# Patient Record
Sex: Male | Born: 1960 | Race: White | Hispanic: No | Marital: Single | Smoking: Never smoker
Health system: Southern US, Community
[De-identification: ages and names within clinical notes are randomized; demographics above are authoritative.]

## PROBLEM LIST (undated history)

## (undated) DIAGNOSIS — G20 Parkinson's disease: Secondary | ICD-10-CM

## (undated) DIAGNOSIS — G20A1 Parkinson's disease without dyskinesia, without mention of fluctuations: Secondary | ICD-10-CM

## (undated) DIAGNOSIS — IMO0002 Reserved for concepts with insufficient information to code with codable children: Secondary | ICD-10-CM

## (undated) HISTORY — DX: Reserved for concepts with insufficient information to code with codable children: IMO0002

## (undated) HISTORY — PX: FACIAL LACERATIONS REPAIR: SHX1571

---

## 2002-12-20 ENCOUNTER — Emergency Department (HOSPITAL_COMMUNITY): Admission: EM | Admit: 2002-12-20 | Discharge: 2002-12-20 | Payer: Self-pay | Admitting: Emergency Medicine

## 2005-02-06 ENCOUNTER — Ambulatory Visit: Payer: Self-pay | Admitting: Internal Medicine

## 2005-04-14 ENCOUNTER — Ambulatory Visit: Payer: Self-pay | Admitting: Internal Medicine

## 2005-09-05 ENCOUNTER — Ambulatory Visit: Payer: Self-pay | Admitting: Internal Medicine

## 2006-03-27 ENCOUNTER — Ambulatory Visit: Payer: Self-pay | Admitting: Internal Medicine

## 2006-11-10 ENCOUNTER — Ambulatory Visit: Payer: Self-pay | Admitting: Internal Medicine

## 2007-01-26 ENCOUNTER — Ambulatory Visit: Payer: Self-pay | Admitting: Internal Medicine

## 2007-09-14 ENCOUNTER — Emergency Department (HOSPITAL_COMMUNITY): Admission: EM | Admit: 2007-09-14 | Discharge: 2007-09-14 | Payer: Self-pay | Admitting: Emergency Medicine

## 2007-10-08 ENCOUNTER — Encounter: Payer: Self-pay | Admitting: Internal Medicine

## 2008-05-06 ENCOUNTER — Emergency Department (HOSPITAL_COMMUNITY): Admission: EM | Admit: 2008-05-06 | Discharge: 2008-05-07 | Payer: Self-pay | Admitting: Emergency Medicine

## 2008-06-10 ENCOUNTER — Emergency Department (HOSPITAL_COMMUNITY): Admission: EM | Admit: 2008-06-10 | Discharge: 2008-06-10 | Payer: Self-pay | Admitting: Emergency Medicine

## 2009-07-02 ENCOUNTER — Ambulatory Visit: Payer: Self-pay | Admitting: Internal Medicine

## 2009-07-02 DIAGNOSIS — F988 Other specified behavioral and emotional disorders with onset usually occurring in childhood and adolescence: Secondary | ICD-10-CM | POA: Insufficient documentation

## 2009-07-03 DIAGNOSIS — Z8711 Personal history of peptic ulcer disease: Secondary | ICD-10-CM

## 2011-02-21 NOTE — Assessment & Plan Note (Signed)
[facility]                                 ON-CALL NOTE   KARVER, FADDEN                        MRN:          [id number]  DATE:05/13/2007                            DOB:          [birth year]    PHONE NUMBER:  [phone].   PRIMARY CARE PHYSICIAN:  Dr. Debby Bud.   TIME OF CALL:  9:34 p.m.   SUBJECTIVE:  Micheal Carter reports that he is having pain and burning in  his rectal area that he feels is associated with hemorrhoids. He has had  similar problems in the past. He states he had a small amount of bright  red blood per rectum yesterday and none today. He denies dizziness,  chest pain or shortness of breath,   ASSESSMENT/PLAN:  Recommended sitz baths 15-20 minutes 3-4 times a day.  I called in a prescription for AnaMantle ointment to apply b.i.d. If his  symptoms are not improving, I suggested he see a physician tomorrow if  they are severe and on Monday if things start to improve. He may need to  be seen to have a thrombosed hemorrhoid removed.     Kerby Nora, MD  Electronically Signed    AB/MedQ  DD: 05/13/2007  DT: 05/14/2007  Job #: [id number]

## 2011-07-14 LAB — COMPREHENSIVE METABOLIC PANEL
ALT: 29
AST: 22
Albumin: 3.7
Calcium: 9.3
Creatinine, Ser: 0.77
Glucose, Bld: 97
Sodium: 138

## 2011-07-14 LAB — POCT CARDIAC MARKERS
CKMB, poc: <1 — ABNORMAL LOW
Myoglobin, poc: 62.2
Operator id: 4001
Troponin i, poc: <0.05

## 2011-07-14 LAB — APTT: aPTT: 29

## 2011-07-14 LAB — DIFFERENTIAL
Basophils Absolute: 0.1
Basophils Relative: 2 — ABNORMAL HIGH
Eosinophils Absolute: 0.1 — ABNORMAL LOW
Eosinophils Relative: 2
Lymphocytes Relative: 28
Monocytes Absolute: 0.4
Neutrophils Relative %: 58

## 2011-07-14 LAB — PROTIME-INR
INR: 1.1
Prothrombin Time: 14.5

## 2011-07-14 LAB — CBC
MCHC: 34.8
MCV: 86
WBC: 4.4

## 2011-08-10 ENCOUNTER — Encounter: Payer: Self-pay | Admitting: *Deleted

## 2011-08-10 ENCOUNTER — Emergency Department (HOSPITAL_COMMUNITY)
Admission: EM | Admit: 2011-08-10 | Discharge: 2011-08-10 | Disposition: A | Discharge status: Home / Self Care | Payer: Self-pay | Attending: Emergency Medicine | Admitting: Emergency Medicine

## 2011-08-10 ENCOUNTER — Emergency Department (HOSPITAL_COMMUNITY): Payer: Self-pay

## 2011-08-10 DIAGNOSIS — M25461 Effusion, right knee: Secondary | ICD-10-CM

## 2011-08-10 DIAGNOSIS — Z7982 Long term (current) use of aspirin: Secondary | ICD-10-CM | POA: Insufficient documentation

## 2011-08-10 DIAGNOSIS — IMO0002 Reserved for concepts with insufficient information to code with codable children: Secondary | ICD-10-CM | POA: Insufficient documentation

## 2011-08-10 DIAGNOSIS — M1710 Unilateral primary osteoarthritis, unspecified knee: Secondary | ICD-10-CM

## 2011-08-10 DIAGNOSIS — M25469 Effusion, unspecified knee: Secondary | ICD-10-CM | POA: Insufficient documentation

## 2011-08-10 DIAGNOSIS — M25569 Pain in unspecified knee: Secondary | ICD-10-CM | POA: Insufficient documentation

## 2011-08-10 MED ORDER — IBUPROFEN 800 MG PO TABS: 800.0000 mg | ORAL_TABLET | Freq: Three times a day (TID) | ORAL | Status: AC | PRN

## 2011-08-10 NOTE — ED Notes (Signed)
Pt c/o rt knee swelling for a week to 10 days, noted to have swelling on arrival. Able to ambulate on ext

## 2011-08-10 NOTE — ED Provider Notes (Signed)
History     CSN: [id number] Arrival date & time: 08/10/2011 10:26 AM   First MD Initiated Contact with Patient 08/10/11 1051      Chief Complaint  Patient presents with  . Knee Pain    (Consider location/radiation/quality/duration/timing/severity/associated sxs/prior treatment) Patient is a 50 y.o. male presenting with knee pain. The history is provided by the patient.  Knee Pain This is a new problem.   patient states that about a week, and he developed knee soreness and pain.  He states that over that time it has gradually gotten more swollen.  Patient states that he did not fall or injure the knee in any way.  Patient states, that he has never had history of gout or any problems with his knee.  This is involving his right knee.   History reviewed. No pertinent past medical history.  History reviewed. No pertinent past surgical history.  No family history on file.  History  Substance Use Topics  . Smoking status: Never Smoker   . Smokeless tobacco: Not on file  . Alcohol Use: Yes      Review of Systems  All other systems reviewed and are negative.    Allergies  Review of patient's allergies indicates no known allergies.  Home Medications   Current Outpatient Rx  Name Route Sig Dispense Refill  . ASPIRIN EC 81 MG PO TBEC Oral Take 81 mg by mouth daily as needed. For headache/pain.       BP 150/80  Pulse 92  Temp(Src) 99.3 F (37.4 C) (Oral)  Resp 18  SpO2 97%  Physical Exam  Constitutional: He appears well-developed and well-nourished.  Cardiovascular: Normal rate and regular rhythm.   Pulmonary/Chest: Effort normal and breath sounds normal.  Musculoskeletal:       Right knee: He exhibits swelling and effusion. He exhibits no ecchymosis, no deformity, no erythema and normal patellar mobility.  Skin: Skin is warm, dry and intact.    ED Course  Procedures (including critical care time)  Labs Reviewed - No data to display Dg Knee Complete 4 Views  Right  08/10/2011  *RADIOLOGY REPORT*  Clinical Data: Pain and swelling  RIGHT KNEE - COMPLETE 4+ VIEW  Comparison: None.  Findings: No evidence for fracture.  No subluxation or dislocation. Hypertrophic spurring is visible in all three compartments.  Joint effusion is evident within the suprapatellar bursa.  IMPRESSION: Tricompartmental degenerative changes with joint effusion.  Original Report Authenticated By: ERIC A. MANSELL, M.D.     No diagnosis found.    MDM  Patient has a joint effusion, which is most likely related to a degeneration of his knee Joint.  The patient is offered needle aspiration of the joint and he refused.  This seems less likely to be a septic joint based on the physical exam findings and x-ray findings.  The patient will be referred to orthopedics and given NSAIDs for pain and swelling.  Knee sleeve will be applied for compression       Carlyle Dolly, PA 08/10/11 1205

## 2011-08-10 NOTE — ED Provider Notes (Signed)
Medical screening examination/treatment/procedure(s) were performed by non-physician practitioner and as supervising physician. I saw and examined the patient and discussed test results. Right lower extremity knee swollen slightly not red or warm. Diffusion present neurovascular intact Medical decision making: Knee effusion most likely secondary to degenerative arthritis. Strongly doubt gallop or infection.  Doug Sou, MD 08/10/11 1227

## 2012-02-22 ENCOUNTER — Emergency Department (HOSPITAL_COMMUNITY)
Admission: EM | Admit: 2012-02-22 | Discharge: 2012-02-22 | Disposition: A | Discharge status: Home / Self Care | Payer: Self-pay | Attending: Emergency Medicine | Admitting: Emergency Medicine

## 2012-02-22 ENCOUNTER — Encounter (HOSPITAL_COMMUNITY): Payer: Self-pay

## 2012-02-22 DIAGNOSIS — R0789 Other chest pain: Secondary | ICD-10-CM | POA: Insufficient documentation

## 2012-02-22 DIAGNOSIS — R209 Unspecified disturbances of skin sensation: Secondary | ICD-10-CM | POA: Insufficient documentation

## 2012-02-22 DIAGNOSIS — R202 Paresthesia of skin: Secondary | ICD-10-CM

## 2012-02-22 DIAGNOSIS — R29898 Other symptoms and signs involving the musculoskeletal system: Secondary | ICD-10-CM | POA: Insufficient documentation

## 2012-02-22 LAB — GLUCOSE, CAPILLARY: Glucose-Capillary: 102 mg/dL — ABNORMAL HIGH (ref 70–99)

## 2012-02-22 NOTE — ED Notes (Signed)
Pt c/o tingling in left arm that began "3-4 days ago". Pt denies pain in arm. Pt states the sensation "comes and goes" and at random, no precipitating factors that he notices. Pt denies numbness or tingling anywhere else in the body, denies headache, fever, cough, NVD. Pt states he had mild chest discomfort for a short time a few days ago but nothing since.

## 2012-02-22 NOTE — Discharge Instructions (Signed)
Paresthesia Paresthesia is a burning or prickling feeling. This feeling can happen in any part of the body. It often happens in the hands, arms, legs, or feet. HOME CARE  Avoid drinking alcohol.   Try massage or needle therapy (acupuncture) to help with your problems.   Keep all doctor visits as told.  GET HELP RIGHT AWAY IF:   You feel weak.   You have trouble walking or moving.   You have problems speaking or seeing.   You feel confused.   You cannot control when you poop (bowel movement) or pee (urinate).   You lose feeling (numbness) after an injury.   You pass out (faint).   Your burning or prickling feeling gets worse when you walk.   You have pain, cramps, or feel dizzy.   You have a rash.  MAKE SURE YOU:   Understand these instructions.   Will watch your condition.   Will get help right away if you are not doing well or get worse.  Document Released: 09/04/2008 Document Revised: 09/11/2011 Document Reviewed: 06/13/2011 [employer] Patient Information 2012 [employer]. 

## 2012-02-22 NOTE — ED Notes (Signed)
Pt in from home with c/o left arm tingling states noted several days ago states some chest discomfort states pain is centered ache, denies nausea denies sob

## 2012-02-22 NOTE — ED Provider Notes (Signed)
History     CSN: [id number] Arrival date & time 02/22/12  1859 First MD Initiated Contact with Patient 02/22/12 2023     Chief Complaint  Patient presents with  . Extremity Weakness    HPI Patient presents to the emergency room with complaints of left arm tingling. Patient states the symptoms have been rather mild and have been ongoing for the last couple of days. Symptoms seem to come and go. He cannot think of anything in particular that precipitates it. 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 The symptoms have started probably a day or 2 before that. He has not had any neck pain. He has not had any headache, numbness or weakness. He has not had any trouble with his grip strength or his balance. He has not had any trouble with slurred speech or his vision. Patient also mentions that intermittently he has had chest discomfort that has been ongoing for years and was not associated with these symptoms today.  History reviewed. No pertinent past medical history.  History reviewed. No pertinent past surgical history.  History reviewed. No pertinent family history.  History  Substance Use Topics  . Smoking status: Never Smoker   . Smokeless tobacco: Not on file  . Alcohol Use: Yes      Review of Systems  All other systems reviewed and are negative.    Allergies  Review of patient's allergies indicates no known allergies.  Home Medications   Current Outpatient Rx  Name Route Sig Dispense Refill  . ASPIRIN EC 81 MG PO TBEC Oral Take 81 mg by mouth daily as needed. For headache/pain.       BP 168/100  Pulse 90  Temp(Src) 98.6 F (37 C) (Oral)  Resp 20  Ht 5\' 10"  (1.778 m)  Wt 195 lb (88.451 kg)  BMI 27.98 kg/m2  SpO2 98%  Physical Exam  Nursing note and vitals reviewed. Constitutional: He is oriented to person, place, and time. He appears well-developed and  well-nourished. No distress.  HENT:  Head: Normocephalic and atraumatic.  Right Ear: External ear normal.  Left Ear: External ear normal.  Mouth/Throat: Oropharynx is clear and moist.  Eyes: Conjunctivae are normal. Right eye exhibits no discharge. Left eye exhibits no discharge. No scleral icterus.  Neck: Neck supple. No tracheal deviation present.  Cardiovascular: Normal rate, regular rhythm and intact distal pulses.   Pulmonary/Chest: Effort normal and breath sounds normal. No stridor. No respiratory distress. He has no wheezes. He has no rales.  Abdominal: Soft. Bowel sounds are normal. He exhibits no distension. There is no tenderness. There is no rebound and no guarding.  Musculoskeletal: He exhibits no edema and no tenderness.  Neurological: He is alert and oriented to person, place, and time. He has normal strength. No sensory deficit. Cranial nerve deficit:  no gross defecits noted. He exhibits normal muscle tone. He displays no seizure activity. Coordination normal.       No pronator drift bilateral upper extrem, able to hold both legs off bed for 5 seconds, sensation intact in all extremities, no visual field cuts, no left or right sided neglect  Skin: Skin is warm and dry. No rash noted.  Psychiatric: He has a normal mood and affect.    ED Course  Procedures (including critical care time)  Rate: 86  Rhythm: normal sinus rhythm  QRS Axis: normal  Intervals:  normal  ST/T Wave abnormalities: normal  Conduction Disutrbances:none  Narrative Interpretation:   Old EKG Reviewed: none available  Labs Reviewed - No data to display No results found.  MDM  Patient has mild paresthesias. He came to the emergency room because he cooled with some of these symptoms could suggest. Patient does not have any other neurologic findings on exam. His symptoms are rather mild. I discussed doing brain imaging for further evaluation and the patient states he would rather not do that. At this time  I feel that it is reasonable. I did encourage him to follow up with his primary Dr. for a followup exam and routine medical screening. I instructed patient to return to emergency room for worsening symptoms, slurred speech, numbness or weakness.        Celene Kras, MD 02/22/12 2045

## 2012-06-03 ENCOUNTER — Encounter: Payer: Self-pay | Admitting: Internal Medicine

## 2012-06-03 ENCOUNTER — Ambulatory Visit (INDEPENDENT_AMBULATORY_CARE_PROVIDER_SITE_OTHER): Payer: Self-pay | Admitting: Internal Medicine

## 2012-06-03 ENCOUNTER — Ambulatory Visit: Payer: Self-pay

## 2012-06-03 VITALS — BP 130/88 | HR 79 | Temp 98.7°F | Wt 207.2 lb

## 2012-06-03 DIAGNOSIS — F1010 Alcohol abuse, uncomplicated: Secondary | ICD-10-CM

## 2012-06-03 LAB — HEPATIC FUNCTION PANEL
ALT: 39 U/L (ref 0–53)
AST: 33 U/L (ref 0–37)
Albumin: 4.3 g/dL (ref 3.5–5.2)
Bilirubin, Direct: 0.1 mg/dL (ref 0.0–0.3)
Total Bilirubin: 0.9 mg/dL (ref 0.3–1.2)
Total Protein: 8 g/dL (ref 6.0–8.3)

## 2012-06-03 LAB — PROTIME-INR: Prothrombin Time: 14.3 seconds (ref 11.6–15.2)

## 2012-06-03 NOTE — Progress Notes (Signed)
  Subjective:    Patient ID: Micheal Carter, male    DOB: 1960-12-15, 51 y.o.   MRN: [id number]  HPI 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 He reports that he had jaundice last summer. He has no prior diagnosis of liver disease but he is very concerned about potential liver damage.    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     Objective:   Physical Exam Filed Vitals:   06/03/12 1126  BP: 130/88  Pulse: 79  Temp: 98.7 F (37.1 C)   Gen'l - WNWD white man in no distress HEENT C&S clear, w/o icterus Cor- RRR Pulm - normal respirations Abd - no palpable liver edge, normal liver span by percussion. Neuro - A&O x 3, no agitation, clear mentation, no delusions or hallucinations (5 days since last drink)       Assessment & Plan:

## 2012-06-03 NOTE — Patient Instructions (Addendum)
Alcohol abuse - it is a very positive event -deciding to stop drinking and actually being abstemious for 5 days. This is a disease and you need to actively work a program of recovery in order to remain abstemious long term. This will obviously have many health benefits for you. The best approach, with a proven track record, is AA. I strongly recommend that you get involved with an AA group NOW.  On exam you skin appears normal, you have no palpable liver abnormality.  Plan- will check liver functions and blood clotting time today to evaluate liver function. Additional studies, if any, will be based on these results.  WORKING YOUR RECOVERY IS THE ABSOLUTE BEST THING YOU CAN DO FOR YOUR HEALTH.   Finding Treatment for Alcohol and Drug Addiction It can be hard to find the right place to get professional treatment. Here are some important things to consider:  There are different types of treatment to choose from.   Some programs are live-in (residential) while others are not (outpatient). Sometimes a combination is offered.   No single type of program is right for everyone.   Most treatment programs involve a combination of education, counseling, and a 12-step, spiritually-based approach.   There are non-spiritually based programs (not 12-step).   Some treatment programs are government sponsored. They are geared for patients without private insurance.   Treatment programs can vary in many respects such as:   Cost and types of insurance accepted.   Types of on-site medical services offered.   Length of stay, setting, and size.   Overall philosophy of treatment.   A person may need specialized treatment or have needs not addressed by all programs. For example, adolescents need treatment appropriate for their age. Other people have secondary disorders that must be managed as well. Secondary conditions can include mental illness, such as depression or diabetes. Often, a period of  detoxification from alcohol or drugs is needed. This requires medical supervision and not all programs offer this. THINGS TO CONSIDER WHEN SELECTING A TREATMENT PROGRAM    Is the program certified by the appropriate government agency? Even private programs must be certified and employ certified professionals.   Does the program accept your insurance? If not, can a payment plan be set up?   Is the facility clean, organized, and well run? Do they allow you to speak with graduates who can share their treatment experience with you? Can you tour the facility? Can you meet with staff?   Does the program meet the full range of individual needs?   Does the treatment program address sexual orientation and physical disabilities? Do they provide age, gender, and culturally appropriate treatment services?   Is treatment available in languages other than English?   Is long-term aftercare support or guidance encouraged and provided?   Is assessment of an individual's treatment plan ongoing to ensure it meets changing needs?   Does the program use strategies to encourage reluctant patients to remain in treatment long enough to increase the likelihood of success?   Does the program offer counseling (individual or group) and other behavioral therapies?   Does the program offer medicine as part of the treatment regimen, if needed?   Is there ongoing monitoring of possible relapse? Is there a defined relapse prevention program? Are services or referrals offered to family members to ensure they understand addiction and the recovery process? This would help them support the recovering individual.   Are 12-step meetings held at the center or  is transport available for patients to attend outside meetings?  In countries outside of the [place]. and [place], Magazine features editor for contact information for services in your area. Document Released: 08/21/2005 Document Revised: 09/11/2011 Document Reviewed:  03/02/2008 [facility] Patient Information 2012 [place], [place].

## 2012-06-05 NOTE — Assessment & Plan Note (Signed)
Patient is ready to stop drinking and embark on recovery. Discussed addiction. He has no stigmata or exam findings to suggest cirrhosis. Skin lesions are benign.   Plan Strongly recommended AA for a durable recovery  Lab - routine LFTs and PT  Addendum - labs are normal.

## 2012-06-11 ENCOUNTER — Encounter: Payer: Self-pay | Admitting: Internal Medicine

## 2012-11-11 ENCOUNTER — Emergency Department (HOSPITAL_COMMUNITY): Payer: Self-pay

## 2012-11-11 ENCOUNTER — Encounter (HOSPITAL_COMMUNITY): Payer: Self-pay | Admitting: *Deleted

## 2012-11-11 ENCOUNTER — Emergency Department (HOSPITAL_COMMUNITY)
Admission: EM | Admit: 2012-11-11 | Discharge: 2012-11-11 | Disposition: A | Discharge status: Home / Self Care | Payer: Self-pay | Attending: Emergency Medicine | Admitting: Emergency Medicine

## 2012-11-11 DIAGNOSIS — R29898 Other symptoms and signs involving the musculoskeletal system: Secondary | ICD-10-CM

## 2012-11-11 DIAGNOSIS — R0989 Other specified symptoms and signs involving the circulatory and respiratory systems: Secondary | ICD-10-CM | POA: Insufficient documentation

## 2012-11-11 DIAGNOSIS — M6281 Muscle weakness (generalized): Secondary | ICD-10-CM | POA: Insufficient documentation

## 2012-11-11 DIAGNOSIS — Z7982 Long term (current) use of aspirin: Secondary | ICD-10-CM | POA: Insufficient documentation

## 2012-11-11 DIAGNOSIS — J3489 Other specified disorders of nose and nasal sinuses: Secondary | ICD-10-CM | POA: Insufficient documentation

## 2012-11-11 DIAGNOSIS — R0609 Other forms of dyspnea: Secondary | ICD-10-CM | POA: Insufficient documentation

## 2012-11-11 DIAGNOSIS — Z8711 Personal history of peptic ulcer disease: Secondary | ICD-10-CM | POA: Insufficient documentation

## 2012-11-11 LAB — COMPREHENSIVE METABOLIC PANEL WITH GFR
ALT: 15 U/L (ref 0–53)
AST: 13 U/L (ref 0–37)
Albumin: 3.9 g/dL (ref 3.5–5.2)
Alkaline Phosphatase: 59 U/L (ref 39–117)
BUN: 21 mg/dL (ref 6–23)
CO2: 26 meq/L (ref 19–32)
Calcium: 9.6 mg/dL (ref 8.4–10.5)
Chloride: 99 meq/L (ref 96–112)
Creatinine, Ser: 1.09 mg/dL (ref 0.50–1.35)
GFR calc Af Amer: 88 mL/min — ABNORMAL LOW
GFR calc non Af Amer: 76 mL/min — ABNORMAL LOW
Glucose, Bld: 91 mg/dL (ref 70–99)
Potassium: 4.5 meq/L (ref 3.5–5.1)
Sodium: 136 meq/L (ref 135–145)
Total Bilirubin: 0.3 mg/dL (ref 0.3–1.2)
Total Protein: 7.9 g/dL (ref 6.0–8.3)

## 2012-11-11 LAB — CBC WITH DIFFERENTIAL/PLATELET
Basophils Absolute: 0 K/uL (ref 0.0–0.1)
Basophils Relative: 0 % (ref 0–1)
Eosinophils Absolute: 0.2 K/uL (ref 0.0–0.7)
Eosinophils Relative: 2 % (ref 0–5)
HCT: 39 % (ref 39.0–52.0)
Hemoglobin: 13.5 g/dL (ref 13.0–17.0)
Lymphocytes Relative: 22 % (ref 12–46)
Lymphs Abs: 1.8 K/uL (ref 0.7–4.0)
MCH: 28.3 pg (ref 26.0–34.0)
MCHC: 34.6 g/dL (ref 30.0–36.0)
MCV: 81.8 fL (ref 78.0–100.0)
Monocytes Absolute: 0.9 K/uL (ref 0.1–1.0)
Monocytes Relative: 11 % (ref 3–12)
Neutro Abs: 5.1 K/uL (ref 1.7–7.7)
Neutrophils Relative %: 65 % (ref 43–77)
Platelets: 251 K/uL (ref 150–400)
RBC: 4.77 MIL/uL (ref 4.22–5.81)
RDW: 12.7 % (ref 11.5–15.5)
WBC: 8 K/uL (ref 4.0–10.5)

## 2012-11-11 LAB — RAPID URINE DRUG SCREEN, HOSP PERFORMED
Amphetamines: NOT DETECTED
Barbiturates: NOT DETECTED
Benzodiazepines: NOT DETECTED
Cocaine: NOT DETECTED
Opiates: NOT DETECTED
Tetrahydrocannabinol: NOT DETECTED

## 2012-11-11 LAB — URINALYSIS, ROUTINE W REFLEX MICROSCOPIC
Bilirubin Urine: NEGATIVE
Glucose, UA: NEGATIVE mg/dL
Hgb urine dipstick: NEGATIVE
Ketones, ur: NEGATIVE mg/dL
Leukocytes, UA: NEGATIVE
Nitrite: NEGATIVE
Protein, ur: NEGATIVE mg/dL
Specific Gravity, Urine: 1.016 (ref 1.005–1.030)
Urobilinogen, UA: 0.2 mg/dL (ref 0.0–1.0)
pH: 6 (ref 5.0–8.0)

## 2012-11-11 LAB — CK: Total CK: 84 U/L (ref 7–232)

## 2012-11-11 LAB — ETHANOL: Alcohol, Ethyl (B): <11 mg/dL (ref 0–11)

## 2012-11-11 LAB — MAGNESIUM: Magnesium: 2 mg/dL (ref 1.5–2.5)

## 2012-11-11 NOTE — ED Notes (Signed)
Patient reports bilateral leg weakness for several years.  Patient has had trouble getting up to a standing position.  Patient has had non-productive cough for three days.  Denies nausea, vomiting, chest pain.

## 2012-11-11 NOTE — ED Provider Notes (Signed)
History   This chart was scribed for non-physician practitioner Ebbie Ridge, PA-C working with Ward Givens, MD by Gerlean Ren, ED Scribe. This patient was seen in room WTR6/WTR6 and the patient's care was started at 7:53 PM.    CSN: [id number]  Arrival date & time 11/11/12  1905   First MD Initiated Contact with Patient 11/11/12 1934      Chief Complaint  Patient presents with  . Cough    The history is provided by the patient. No language interpreter was used.  Micheal Carter is a 52 y.o. male who presents to the Emergency Department complaining of increasingly labored breathing over the past 3 days with associated weakness in bilateral legs localized to feet and lower legs and dry cough.  Pt reports weakness began first followed by dyspnea and then cough, but that dyspnea is most concerning to him at this time.  Dyspnea is not noticeably worsened by ambulation or normal activities.  Pt denies any incident or activity that caused onset of symptoms.  Pt denies nausea, emesis, fevers, HA, blurred vision, swelling in legs, dysuria, sore throat.  Mild rhinorrhea over past week.  Pt denies any specific pain at this time.  No known allergies.  No regular medications.  Pt denies tobacco use, drug use, and states he has not consumed alcohol in 6 months.   PCP is Dr. Debby Bud. Past Medical History  Diagnosis Date  . Ulcer      peptic ulcer disease at age 56    Past Surgical History  Procedure Date  . Facial lacerations repair     Family History  Problem Relation Age of Onset  . Alcohol abuse Mother   . Alcohol abuse Father   . Heart disease Father   . Mental illness Father     bipolar disease    History  Substance Use Topics  . Smoking status: Never Smoker   . Smokeless tobacco: Not on file  . Alcohol Use: Yes      Review of Systems A complete 10 system review of systems was obtained and all systems are negative except as noted in the HPI and PMH.   Allergies  Review of  patient's allergies indicates no known allergies.  Home Medications   Current Outpatient Rx  Name  Route  Sig  Dispense  Refill  . AMPHETAMINE-DEXTROAMPHETAMINE 20 MG PO TABS   Oral   Take 20 mg by mouth daily.         . ASPIRIN EC 81 MG PO TBEC   Oral   Take 81 mg by mouth daily as needed. For headache/pain.            BP 152/87  Pulse 87  Temp 98.9 F (37.2 C) (Oral)  Resp 16  Ht 5\' 10"  (1.778 m)  Wt 210 lb (95.255 kg)  BMI 30.13 kg/m2  SpO2 97%  Physical Exam  Nursing note and vitals reviewed. Constitutional: He is oriented to person, place, and time. He appears well-developed and well-nourished. No distress.  HENT:  Head: Normocephalic and atraumatic.  Eyes: EOM are normal.  Neck: Neck supple. No tracheal deviation present.  Cardiovascular: Normal rate, regular rhythm and normal heart sounds.   Pulmonary/Chest: Effort normal and breath sounds normal. No respiratory distress. He has no wheezes.  Musculoskeletal: Normal range of motion.  Neurological: He is alert and oriented to person, place, and time.  Skin: Skin is warm and dry.  Psychiatric: He has a normal mood and affect.  His behavior is normal.    ED Course  Procedures (including critical care time) DIAGNOSTIC STUDIES: Oxygen Saturation is 97% on room air, adequate by my interpretation.    COORDINATION OF CARE: 8:00 PM- Informed pt of plan to take chest XR and perform blood work.  Pt understands medical decision-making process and agrees with plan.  Results for orders placed during the hospital encounter of 11/11/12  CBC WITH DIFFERENTIAL      Component Value Range   WBC 8.0  4.0 - 10.5 K/uL   RBC 4.77  4.22 - 5.81 MIL/uL   Hemoglobin 13.5  13.0 - 17.0 g/dL   HCT 40.9  81.1 - 91.4 %   MCV 81.8  78.0 - 100.0 fL   MCH 28.3  26.0 - 34.0 pg   MCHC 34.6  30.0 - 36.0 g/dL   RDW 78.2  95.6 - 21.3 %   Platelets 251  150 - 400 K/uL   Neutrophils Relative 65  43 - 77 %   Lymphocytes Relative 22  12 -  46 %   Monocytes Relative 11  3 - 12 %   Eosinophils Relative 2  0 - 5 %   Basophils Relative 0  0 - 1 %   Neutro Abs 5.1  1.7 - 7.7 K/uL   Lymphs Abs 1.8  0.7 - 4.0 K/uL   Monocytes Absolute 0.9  0.1 - 1.0 K/uL   Eosinophils Absolute 0.2  0.0 - 0.7 K/uL   Basophils Absolute 0.0  0.0 - 0.1 K/uL   Smear Review PLATELET CLUMPS NOTED ON SMEAR    COMPREHENSIVE METABOLIC PANEL      Component Value Range   Sodium 136  135 - 145 mEq/L   Potassium 4.5  3.5 - 5.1 mEq/L   Chloride 99  96 - 112 mEq/L   CO2 26  19 - 32 mEq/L   Glucose, Bld 91  70 - 99 mg/dL   BUN 21  6 - 23 mg/dL   Creatinine, Ser 0.86  0.50 - 1.35 mg/dL   Calcium 9.6  8.4 - 57.8 mg/dL   Total Protein 7.9  6.0 - 8.3 g/dL   Albumin 3.9  3.5 - 5.2 g/dL   AST 13  0 - 37 U/L   ALT 15  0 - 53 U/L   Alkaline Phosphatase 59  39 - 117 U/L   Total Bilirubin 0.3  0.3 - 1.2 mg/dL   GFR calc non Af Amer 76 (*) >90 mL/min   GFR calc Af Amer 88 (*) >90 mL/min  ETHANOL      Component Value Range   Alcohol, Ethyl (B) <11  0 - 11 mg/dL  URINE RAPID DRUG SCREEN (HOSP PERFORMED)      Component Value Range   Opiates NONE DETECTED  NONE DETECTED   Cocaine NONE DETECTED  NONE DETECTED   Benzodiazepines NONE DETECTED  NONE DETECTED   Amphetamines NONE DETECTED  NONE DETECTED   Tetrahydrocannabinol NONE DETECTED  NONE DETECTED   Barbiturates NONE DETECTED  NONE DETECTED  URINALYSIS, ROUTINE W REFLEX MICROSCOPIC      Component Value Range   Color, Urine YELLOW  YELLOW   APPearance CLOUDY (*) CLEAR   Specific Gravity, Urine 1.016  1.005 - 1.030   pH 6.0  5.0 - 8.0   Glucose, UA NEGATIVE  NEGATIVE mg/dL   Hgb urine dipstick NEGATIVE  NEGATIVE   Bilirubin Urine NEGATIVE  NEGATIVE   Ketones, ur NEGATIVE  NEGATIVE mg/dL  Protein, ur NEGATIVE  NEGATIVE mg/dL   Urobilinogen, UA 0.2  0.0 - 1.0 mg/dL   Nitrite NEGATIVE  NEGATIVE   Leukocytes, UA NEGATIVE  NEGATIVE  CK      Component Value Range   Total CK 84  7 - 232 U/L  MAGNESIUM       Component Value Range   Magnesium 2.0  1.5 - 2.5 mg/dL    Dg Chest 2 View  10/11/1094  *RADIOLOGY REPORT*  Clinical Data: Cough, shortness of breath  CHEST - 2 VIEW  Comparison: 05/06/2008; 09/14/2007  Findings: Unchanged cardiac silhouette and mediastinal contours. Grossly unchanged left basilar/retrocardiac opacity favored to represent atelectasis.  No new focal airspace opacity.  No definite pleural effusion or pneumothorax.  Unchanged bones.  IMPRESSION: No acute cardiopulmonary disease.   Original Report Authenticated By: Tacey Ruiz, MD    Ct Head Wo Contrast  11/11/2012  *RADIOLOGY REPORT*  Clinical Data: Lower leg weakness.  CT HEAD WITHOUT CONTRAST  Technique:  Contiguous axial images were obtained from the base of the skull through the vertex without contrast.  Comparison: None.  Findings: No intracranial hemorrhage.  Mild nonspecific white matter type changes most notable left frontal lobe without CT findings of large acute thrombotic infarct.  No hydrocephalus.  No intracranial mass lesion detected on this unenhanced exam.  Vascular calcifications.  Right cerebellar tonsils minimally low-lying but within the range of normal limits.  Orbital structures unremarkable.  Mastoid air cells, middle ear cavities and visualized sinuses are clear.  IMPRESSION: Mild nonspecific white matter type changes most notable left frontal lobe without CT evidence of large acute infarct.  No intracranial hemorrhage.  Vascular calcifications.   Original Report Authenticated By: Lacy Duverney, M.D.    Ct Lumbar Spine Wo Contrast  11/11/2012  *RADIOLOGY REPORT*  Clinical Data: Low back pain with bilateral leg weakness.  CT LUMBAR SPINE WITHOUT CONTRAST  Technique:  Multidetector CT imaging of the lumbar spine was performed without intravenous contrast administration. Multiplanar CT image reconstructions were also generated.  Comparison: None.  Findings: Vertebral body height and alignment are normal.  No pars  interarticularis defect is identified.  Imaged intra-abdominal contents are unremarkable.  T12-L1:  Negative.  L1-2:  Mild facet degenerative disease and minimal disc bulge without central canal or foraminal narrowing.  L2-L3:  Minimal disc bulging and mild facet arthropathy without central canal or foraminal stenosis.  L3-4:  Mild broad-based disc bulge is identified.  The central spinal canal and neural foramina appear open.  L4-5:  Mild facet degenerative disease and a shallow disc bulge. The central canal appears open.  Neural foramina appear mildly narrowed.  L5-S1:  Mild facet arthropathy.  Otherwise negative.  IMPRESSION: Mild degenerative disc disease without central canal or foraminal stenosis.  There is mild bilateral foraminal narrowing at L4-5.  No nerve root compression is identified.   Original Report Authenticated By: Holley Dexter, M.D.     I spoke with Dr. Lynelle Doctor, about the patient and she agreed that we need to do a more extensive workup on the patient.  The patient will be advised to return here as needed. Follow up with his PCP. The patient has normal gait here in the ER. The patient will need further outpatient work up.    MDM  MDM Reviewed: vitals and nursing note Interpretation: labs, ECG, x-ray and CT scan           Carlyle Dolly, PA-C 11/11/12 2311

## 2012-11-11 NOTE — ED Notes (Addendum)
EKG shown to MD Dr. Clarene Duke.

## 2012-11-11 NOTE — ED Notes (Signed)
Pt states has had cough/weakness/breathing feels different x 3 days; denies fever

## 2012-11-12 NOTE — ED Provider Notes (Signed)
Pt discussed with me  Medical screening examination/treatment/procedure(s) were performed by non-physician practitioner and as supervising physician I was immediately available for consultation/collaboration. Devoria Albe, MD, FACEP   Ward Givens, MD 11/12/12 [phone]

## 2013-02-21 ENCOUNTER — Encounter (HOSPITAL_COMMUNITY): Payer: Self-pay | Admitting: Emergency Medicine

## 2013-02-21 ENCOUNTER — Emergency Department (HOSPITAL_COMMUNITY)
Admission: EM | Admit: 2013-02-21 | Discharge: 2013-02-21 | Disposition: A | Discharge status: Home / Self Care | Payer: Self-pay | Attending: Emergency Medicine | Admitting: Emergency Medicine

## 2013-02-21 DIAGNOSIS — Y9389 Activity, other specified: Secondary | ICD-10-CM | POA: Insufficient documentation

## 2013-02-21 DIAGNOSIS — Z8711 Personal history of peptic ulcer disease: Secondary | ICD-10-CM | POA: Insufficient documentation

## 2013-02-21 DIAGNOSIS — Y92009 Unspecified place in unspecified non-institutional (private) residence as the place of occurrence of the external cause: Secondary | ICD-10-CM | POA: Insufficient documentation

## 2013-02-21 DIAGNOSIS — Z23 Encounter for immunization: Secondary | ICD-10-CM | POA: Insufficient documentation

## 2013-02-21 DIAGNOSIS — W1809XA Striking against other object with subsequent fall, initial encounter: Secondary | ICD-10-CM | POA: Insufficient documentation

## 2013-02-21 DIAGNOSIS — S0120XA Unspecified open wound of nose, initial encounter: Secondary | ICD-10-CM | POA: Insufficient documentation

## 2013-02-21 DIAGNOSIS — S0121XA Laceration without foreign body of nose, initial encounter: Secondary | ICD-10-CM

## 2013-02-21 MED ORDER — TETANUS-DIPHTH-ACELL PERTUSSIS 5-2.5-18.5 LF-MCG/0.5 IM SUSP
0.5000 mL | Freq: Once | INTRAMUSCULAR | Status: AC
  Administered 2013-02-21: 0.5 mL via INTRAMUSCULAR
  Filled 2013-02-21: qty 0.5

## 2013-02-21 NOTE — ED Provider Notes (Signed)
History     CSN: [id number]  Arrival date & time 02/21/13  1123   First MD Initiated Contact with Patient 02/21/13 1146      Chief Complaint  Patient presents with  . Facial Laceration    (Consider location/radiation/quality/duration/timing/severity/associated sxs/prior treatment) HPI Comments: Patient presents to the ED for facial laceration.  Patient notes he was in his attic yesterday looking for his pet in the dark when he tripped and fell. He hit the right side of his nose on a telescope leg and sustained a superficial laceration.  No associated LOC or nasal deformity.  Bleeding well controlled on arrival to the ED.  Last tetanus unknown.  Sensation intact.  The history is provided by the patient.    Past Medical History  Diagnosis Date  . Ulcer      peptic ulcer disease at age 22    Past Surgical History  Procedure Laterality Date  . Facial lacerations repair      Family History  Problem Relation Age of Onset  . Alcohol abuse Mother   . Alcohol abuse Father   . Heart disease Father   . Mental illness Father     bipolar disease    History  Substance Use Topics  . Smoking status: Never Smoker   . Smokeless tobacco: Not on file  . Alcohol Use: Yes      Review of Systems  Skin: Positive for wound.  All other systems reviewed and are negative.    Allergies  Review of patient's allergies indicates no known allergies.  Home Medications  No current outpatient prescriptions on file.  BP 161/87  Pulse 93  Temp(Src) 97.5 F (36.4 C) (Oral)  SpO2 96%  Physical Exam  Nursing note and vitals reviewed. Constitutional: He is oriented to person, place, and time. He appears well-developed and well-nourished.  HENT:  Head: Normocephalic and atraumatic.  Nose: Nose lacerations present. No mucosal edema, rhinorrhea, sinus tenderness, nasal deformity, septal deviation or nasal septal hematoma. No epistaxis.  No foreign bodies.    Small 3cm, superficial  laceration to right nostril, bleeding well controlled; no FB, surrounding erythema, or signs of infection  Eyes: Conjunctivae and EOM are normal.  Neck: Normal range of motion. Neck supple.  Cardiovascular: Normal rate, regular rhythm and normal heart sounds.   Pulmonary/Chest: Effort normal and breath sounds normal. No respiratory distress.  Musculoskeletal: Normal range of motion.  Neurological: He is alert and oriented to person, place, and time.  Skin: Skin is warm and dry.  Psychiatric: He has a normal mood and affect.    ED Course  Procedures (including critical care time)  LACERATION REPAIR Performed by: Garlon Hatchet Authorized by: Garlon Hatchet Consent: Verbal consent obtained. Risks and benefits: risks, benefits and alternatives were discussed Consent given by: patient Patient identity confirmed: provided demographic data Prepped and Draped in normal sterile fashion Wound explored  Laceration Location: left nostril  Laceration Length: 3cm  No Foreign Bodies seen or palpated  Anesthesia: none  Local anesthetic: none  Anesthetic total: none  Irrigation method: syringe Amount of cleaning: standard  Skin closure: dermabond  Number of sutures: none  Technique: n/a  Patient tolerance: Patient tolerated the procedure well with no immediate complications.   Labs Reviewed - No data to display No results found.   1. Laceration of nose without complication, initial encounter       MDM   52 year old male presenting to the ED for laceration of right nostril. The wound  was irrigated with normal saline, no foreign bodies present. Laceration repaired with Dermabond. Tetanus updated in the ED. Instructed on home wound care.  Followup with primary care physician if problems occur. Return precautions advised.    Garlon Hatchet, PA-C 02/21/13 1622  Garlon Hatchet, PA-C 02/21/13 1623

## 2013-02-21 NOTE — ED Notes (Signed)
Pt states that he fell yesterday and hit his rt side of his nose on the end of the telescope and has an abrasion to rt side of nose. Has brusing under rt eye no loc, no dizzyness. Alert x4, bleeding controlled and has some dried blood.

## 2013-02-22 NOTE — ED Provider Notes (Signed)
Medical screening examination/treatment/procedure(s) were performed by non-physician practitioner and as supervising physician I was immediately available for consultation/collaboration.    Keiffer L Teddie Curd, MD 02/22/13 0703 

## 2014-12-07 ENCOUNTER — Encounter (HOSPITAL_COMMUNITY): Payer: Self-pay | Admitting: Emergency Medicine

## 2014-12-07 ENCOUNTER — Emergency Department (HOSPITAL_COMMUNITY): Payer: Self-pay

## 2014-12-07 ENCOUNTER — Emergency Department (HOSPITAL_COMMUNITY)
Admission: EM | Admit: 2014-12-07 | Discharge: 2014-12-07 | Disposition: A | Discharge status: Home / Self Care | Payer: Self-pay | Attending: Emergency Medicine | Admitting: Emergency Medicine

## 2014-12-07 DIAGNOSIS — Z79899 Other long term (current) drug therapy: Secondary | ICD-10-CM | POA: Insufficient documentation

## 2014-12-07 DIAGNOSIS — J40 Bronchitis, not specified as acute or chronic: Secondary | ICD-10-CM

## 2014-12-07 DIAGNOSIS — Z7982 Long term (current) use of aspirin: Secondary | ICD-10-CM | POA: Insufficient documentation

## 2014-12-07 DIAGNOSIS — J209 Acute bronchitis, unspecified: Secondary | ICD-10-CM | POA: Insufficient documentation

## 2014-12-07 DIAGNOSIS — Z8711 Personal history of peptic ulcer disease: Secondary | ICD-10-CM | POA: Insufficient documentation

## 2014-12-07 MED ORDER — GUAIFENESIN ER 1200 MG PO TB12
1.0000 | ORAL_TABLET | Freq: Two times a day (BID) | ORAL | Status: DC
Start: 2014-12-07 — End: 2016-12-25

## 2014-12-07 MED ORDER — IPRATROPIUM-ALBUTEROL 0.5-2.5 (3) MG/3ML IN SOLN
3.0000 mL | Freq: Once | RESPIRATORY_TRACT | Status: DC
  Filled 2014-12-07: qty 3

## 2014-12-07 MED ORDER — PROMETHAZINE-DM 6.25-15 MG/5ML PO SYRP: 5.0000 mL | ORAL_SOLUTION | Freq: Four times a day (QID) | ORAL | Status: DC | PRN

## 2014-12-07 MED ORDER — AEROCHAMBER PLUS FLO-VU MEDIUM MISC
1.0000 | Freq: Once | Status: DC
  Filled 2014-12-07 (×2): qty 1

## 2014-12-07 MED ORDER — ALBUTEROL SULFATE HFA 108 (90 BASE) MCG/ACT IN AERS
2.0000 | INHALATION_SPRAY | RESPIRATORY_TRACT | Status: DC | PRN
  Administered 2014-12-07: 2 via RESPIRATORY_TRACT
  Filled 2014-12-07: qty 6.7

## 2014-12-07 MED ORDER — ALBUTEROL SULFATE (2.5 MG/3ML) 0.083% IN NEBU
5.0000 mg | INHALATION_SOLUTION | Freq: Once | RESPIRATORY_TRACT | Status: AC
  Administered 2014-12-07: 5 mg via RESPIRATORY_TRACT
  Filled 2014-12-07: qty 6

## 2014-12-07 MED ORDER — PREDNISONE 20 MG PO TABS
60.0000 mg | ORAL_TABLET | Freq: Once | ORAL | Status: AC
  Administered 2014-12-07: 60 mg via ORAL
  Filled 2014-12-07: qty 3

## 2014-12-07 MED ORDER — AZITHROMYCIN 250 MG PO TABS: ORAL_TABLET | ORAL | Status: DC

## 2014-12-07 MED ORDER — PREDNISONE 50 MG PO TABS: 50.0000 mg | ORAL_TABLET | Freq: Every day | ORAL | Status: DC

## 2014-12-07 NOTE — ED Provider Notes (Signed)
CSN: [id number]     Arrival date & time 12/07/14  1238 History   First MD Initiated Contact with Patient 12/07/14 1246     Chief Complaint  Patient presents with  . Cough     (Consider location/radiation/quality/duration/timing/severity/associated sxs/prior Treatment) HPI Patient presents to the emergency department with cough that started 10 days ago.  The patient states that he has had no fever, nausea, vomiting, weakness, dizziness, headache, blurred vision, back pain, neck pain, dysuria, abdominal pain, sore throat, runny nose, nasal congestion or syncope.  The patient states that nothing seems make his condition, better or worse.  Patient states that he is not a smoker Past Medical History  Diagnosis Date  . Ulcer      peptic ulcer disease at age 54   Past Surgical History  Procedure Laterality Date  . Facial lacerations repair     Family History  Problem Relation Age of Onset  . Alcohol abuse Mother   . Alcohol abuse Father   . Heart disease Father   . Mental illness Father     bipolar disease   History  Substance Use Topics  . Smoking status: Never Smoker   . Smokeless tobacco: Not on file  . Alcohol Use: Yes    Review of Systems All other systems negative except as documented in the HPI. All pertinent positives and negatives as reviewed in the HPI.   Allergies  Review of patient's allergies indicates no known allergies.  Home Medications   Prior to Admission medications   Medication Sig Start Date End Date Taking? Authorizing Provider  aspirin 325 MG EC tablet Take 650 mg by mouth every 8 (eight) hours as needed for pain.   Yes Historical Provider, MD  ranitidine (ZANTAC) 150 MG tablet Take 150 mg by mouth daily.   Yes Historical Provider, MD   BP 135/88 mmHg  Pulse 97  Temp(Src) 99 F (37.2 C) (Oral)  Resp 16  SpO2 100% Physical Exam  Constitutional: He is oriented to person, place, and time. He appears well-developed and well-nourished. No distress.   HENT:  Head: Normocephalic and atraumatic.  Mouth/Throat: Oropharynx is clear and moist.  Eyes: Pupils are equal, round, and reactive to light.  Neck: Normal range of motion. Neck supple.  Cardiovascular: Normal rate, regular rhythm and normal heart sounds.  Exam reveals no gallop and no friction rub.   No murmur heard. Pulmonary/Chest: Effort normal. No respiratory distress. He has wheezes. He has no rales.  Neurological: He is alert and oriented to person, place, and time. He exhibits normal muscle tone. Coordination normal.  Skin: Skin is warm and dry. No rash noted. No erythema.  Nursing note and vitals reviewed.   ED Course  Procedures (including critical care time)  Imaging Review Dg Chest 2 View  12/07/2014   CLINICAL DATA:  Cough for 10 days, intermittent chills  EXAM: CHEST  2 VIEW  COMPARISON:  11/11/2012  FINDINGS: Cardiomediastinal silhouette is stable. No acute infiltrate or pleural effusion. No pulmonary edema. Bony thorax is unremarkable.  IMPRESSION: No active cardiopulmonary disease.   Electronically Signed   By: Natasha MeadLiviu  Pop M.D.   On: 12/07/2014 13:37   Patient is a negative chest x-ray.  Told to return here as needed.  We will treat for bronchitis.  Told to increase his fluid intake and rest as much as possible  MDM   Final diagnoses:  None       Carlyle DollyChristopher W Duriel Deery, PA-C 12/07/14 1402  Claude MangesAnthony T  Freida Busman, MD 12/08/14 1011

## 2014-12-07 NOTE — Discharge Instructions (Signed)
Return here as needed.  Follow-up with your primary care Dr. increase her fluid intake and rest as much as possible °

## 2014-12-07 NOTE — ED Notes (Signed)
Pt c/o dry cough x 10 days.  No other sx.

## 2014-12-07 NOTE — ED Notes (Signed)
Pt refused vital signs.

## 2016-04-10 ENCOUNTER — Emergency Department (HOSPITAL_COMMUNITY)
Admission: EM | Admit: 2016-04-10 | Discharge: 2016-04-10 | Disposition: A | Discharge status: Home / Self Care | Payer: Self-pay | Attending: Emergency Medicine | Admitting: Emergency Medicine

## 2016-04-10 ENCOUNTER — Encounter (HOSPITAL_COMMUNITY): Payer: Self-pay | Admitting: Emergency Medicine

## 2016-04-10 DIAGNOSIS — T550X4A Toxic effect of soaps, undetermined, initial encounter: Secondary | ICD-10-CM | POA: Insufficient documentation

## 2016-04-10 DIAGNOSIS — Z7982 Long term (current) use of aspirin: Secondary | ICD-10-CM | POA: Insufficient documentation

## 2016-04-10 DIAGNOSIS — H10213 Acute toxic conjunctivitis, bilateral: Secondary | ICD-10-CM | POA: Insufficient documentation

## 2016-04-10 MED ORDER — IBUPROFEN 800 MG PO TABS
800.0000 mg | ORAL_TABLET | Freq: Once | ORAL | Status: AC
  Administered 2016-04-10: 800 mg via ORAL
  Filled 2016-04-10: qty 1

## 2016-04-10 MED ORDER — KETOROLAC TROMETHAMINE 0.5 % OP SOLN
1.0000 [drp] | Freq: Once | OPHTHALMIC | Status: AC
  Administered 2016-04-10: 1 [drp] via OPHTHALMIC
  Filled 2016-04-10: qty 3

## 2016-04-10 MED ORDER — TETRACAINE HCL 0.5 % OP SOLN
2.0000 [drp] | Freq: Once | OPHTHALMIC | Status: AC
  Administered 2016-04-10: 2 [drp] via OPHTHALMIC
  Filled 2016-04-10: qty 4

## 2016-04-10 MED ORDER — HYDROCODONE-ACETAMINOPHEN 5-325 MG PO TABS: 0.5000 | ORAL_TABLET | ORAL | Status: DC | PRN

## 2016-04-10 MED ORDER — FLUORESCEIN SODIUM 1 MG OP STRP
1.0000 | ORAL_STRIP | Freq: Once | OPHTHALMIC | Status: AC
  Administered 2016-04-10: 1 via OPHTHALMIC
  Filled 2016-04-10: qty 1

## 2016-04-10 MED ORDER — ERYTHROMYCIN 5 MG/GM OP OINT: TOPICAL_OINTMENT | OPHTHALMIC | Status: DC

## 2016-04-10 MED ORDER — TETRACAINE HCL 0.5 % OP SOLN: 2.0000 [drp] | Freq: Once | OPHTHALMIC | Status: DC

## 2016-04-10 NOTE — ED Provider Notes (Signed)
CSN: [id number]     Arrival date & time 04/10/16  1746 History  By signing my name below, I, Phillis HaggisGabriella Gaje, attest that this documentation has been prepared under the direction and in the presence of Arthor CaptainAbigail Elka Satterfield, PA-C. Electronically Signed: Phillis HaggisGabriella Gaje, ED Scribe. 04/10/2016. 8:33 PM.   Chief Complaint  Patient presents with  . Eye Pain   The history is provided by the patient. No language interpreter was used.  HPI Comments: Micheal Carter is a 55 y.o. male who presents to the Emergency Department complaining of bilateral eye pain onset 2 hours ago. Pt reports associated eye redness. He states that he was washing his hair with shampoo when the shampoo got in his eyes, causing them to burn. He states that he washed out his eyes and used eyedrops to no relief. He denies hx of eye problems, visual disturbances, photophobia, headache, fever, or chills. Pt does not wear contacts.   Past Medical History  Diagnosis Date  . Ulcer      peptic ulcer disease at age 55   Past Surgical History  Procedure Laterality Date  . Facial lacerations repair     Family History  Problem Relation Age of Onset  . Alcohol abuse Mother   . Alcohol abuse Father   . Heart disease Father   . Mental illness Father     bipolar disease   Social History  Substance Use Topics  . Smoking status: Never Smoker   . Smokeless tobacco: None  . Alcohol Use: Yes    Review of Systems  Constitutional: Negative for fever and chills.  Eyes: Positive for pain and redness. Negative for photophobia and visual disturbance.  Neurological: Negative for headaches.   Allergies  Review of patient's allergies indicates no known allergies.  Home Medications   Prior to Admission medications   Medication Sig Start Date End Date Taking? Authorizing Provider  aspirin 325 MG EC tablet Take 650 mg by mouth every 8 (eight) hours as needed for pain.    Historical Provider, MD  azithromycin (ZITHROMAX) 250 MG tablet 2 PO day 1  then 1 PO qd 12/07/14   Charlestine Nighthristopher Lawyer, PA-C  Guaifenesin 1200 MG TB12 Take 1 tablet (1,200 mg total) by mouth 2 (two) times daily. 12/07/14   Charlestine Nighthristopher Lawyer, PA-C  predniSONE (DELTASONE) 50 MG tablet Take 1 tablet (50 mg total) by mouth daily. 12/07/14   Charlestine Nighthristopher Lawyer, PA-C  promethazine-dextromethorphan (PROMETHAZINE-DM) 6.25-15 MG/5ML syrup Take 5 mLs by mouth 4 (four) times daily as needed for cough. 12/07/14   Charlestine Nighthristopher Lawyer, PA-C  ranitidine (ZANTAC) 150 MG tablet Take 150 mg by mouth daily.    Historical Provider, MD   BP 153/111 mmHg  Pulse 79  Temp(Src) 98.9 F (37.2 C) (Oral)  Resp 22  SpO2 100% Physical Exam  Constitutional: He is oriented to person, place, and time. He appears well-developed and well-nourished.  HENT:  Head: Normocephalic and atraumatic.  Mouth/Throat: Oropharynx is clear and moist.  Eyes: EOM are normal. Pupils are equal, round, and reactive to light. Right eye exhibits discharge. Left eye exhibits discharge. Right conjunctiva is injected. Left conjunctiva is injected.  Slit lamp exam:      The right eye shows no corneal abrasion and no fluorescein uptake.       The left eye shows no corneal abrasion and no fluorescein uptake.  Pressures bilaterally are 16; pH of 7 in both eyes.  Neck: Normal range of motion. Neck supple.  Musculoskeletal: Normal range of  motion.  Neurological: He is alert and oriented to person, place, and time.  Skin: Skin is warm and dry.  Psychiatric: He has a normal mood and affect. His behavior is normal.  Nursing note and vitals reviewed.   ED Course  Procedures (including critical care time) DIAGNOSTIC STUDIES: Oxygen Saturation is 100% on RA, normal by my interpretation.    COORDINATION OF CARE: 7:31 PM-Discussed treatment plan which includes tetracaine drops and eye exam with pt at bedside and pt agreed to plan.    Labs Review Labs Reviewed - No data to display  Imaging Review No results found. I have  personally reviewed and evaluated these images and lab results as part of my medical decision-making.   EKG Interpretation None      MDM   Pt dx likely chemical conjunctivitis based on presentation & eye exam.  No evidence of HSV or VSV infection. Pt is not a contact lens wearer.  Exam non-concerning for orbital cellulitis, hyphema, corneal ulcers, corneal abrasions or trauma Patient understands to follow up with ophthalmology, especially if new symptoms including change in vision, purulent drainage, or entrapment occur.  Treated with romycin and acular cruops  Final diagnoses:  Acute chemical conjunctivitis of both eyes     I personally performed the services described in this documentation, which was scribed in my presence. The recorded information has been reviewed and is accurate.        Arthor Captainbigail Dareth Andrew, PA-C 04/11/16 2157  Mancel BaleElliott Wentz, MD 04/12/16 1116

## 2016-04-10 NOTE — Discharge Instructions (Signed)
Chemical Conjunctivitis Chemical conjunctivitis is eye inflammation from exposure to an irritant or chemical substance. This causes the clear membrane that covers the white part of your eye and the inner surface of your eyelid (conjunctiva) to become inflamed. When the blood vessels in the conjunctiva become inflamed, the eye may become red, pink, and itchy. Chemical conjunctivitis can occur in one or both eyes. It cannot be spread by one person to another person (noncontagious). CAUSES This condition is caused by exposure to a chemical substance or irritant, such as:  Smoke.  Chlorine.  Soap.  Fumes.  Air pollution. RISK FACTORS This condition is more likely to develop in:  People who live somewhere with high levels of air pollution.  People who use swimming pools often. SYMPTOMS Symptoms of this condition may include:  Eye redness.  Tearing of the eyes.  Watery eyes.  Itchy eyes.  Burning feeling in the eyes.  Clear drainage from the eyes.  Swollen eyelids.  Sensitivity to light. DIAGNOSIS Your health care provider can diagnose this condition from your symptoms and medical history. The health care provider will also do a physical exam. If you have drainage from your eyes, it may be tested to rule out other causes of conjunctivitis. Your health care provider may also use a medical instrument that uses magnified light to examine the eyes (slit lamp).  TREATMENT Treatment for this condition involves carefully flushing the chemical out of your eye. You may also get antiallergy medicines or eye drops to use at home. HOME CARE INSTRUCTIONS  Take or apply medicines only as directed by your health care provider.  Do not touch or rub your eyes.  Do not wear contact lenses until the inflammation is gone. Wear glasses instead.  Do not wear eye makeup until the inflammation is gone.  Apply a cool, clean washcloth to your eye for 10-20 minutes, 3-4 times a day.  Avoid  exposure to the chemical or environment that caused the irritation. Wear eye protection as necessary. SEEK MEDICAL CARE IF:  Your symptoms get worse.  You have pus draining from your eye.  You have new symptoms.  You have a fever.  You have a change in vision.  You have increasing pain.   This information is not intended to replace advice given to you by your health care provider. Make sure you discuss any questions you have with your health care provider.   Document Released: 07/02/2005 Document Revised: 10/13/2014 Document Reviewed: 07/04/2014 Elsevier Interactive Patient Education 2016 [employer]vier Inc.  

## 2016-04-10 NOTE — ED Notes (Signed)
Currently irrigating both eyes with NS

## 2016-04-10 NOTE — ED Notes (Signed)
Patient washed his hair with herbal escapes clarifying shampoo, got shampoo in his eyes, and stated they started burning. Attempted to wash them out without relief. Placed eye drops in his eyes and it made them burn worse. Patient actively yelling and thrashing around in triage. Eyes red.

## 2016-12-23 NOTE — Progress Notes (Signed)
Subjective:   Micheal Carter was seen in consultation in the movement disorder clinic for tremor.  Pt is accompanied by his best friend who supplements the history.  Tremor started approximately 2-3 years ago and involves the bilateral UE; he notes in the R hand more but is R hand more.  Tremor is most noticeable when he is using the hand, especially texting or eating.   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  There is no family hx of tremor.    Affected by caffeine:  Doesn't drink caffeine  Affected by alcohol:  No. (couple beers and few shots) Affected by stress:  Yes.   Affected by fatigue:  No. Spills soup if on spoon:  May or may not Spills glass of liquid if full:  May or may not Affects ADL's (tying shoes, brushing teeth, etc):  No. (but may have trouble trimming beard)  Current/Previously tried tremor medications: n/a  Current medications that may exacerbate tremor:  n/a  No Known Allergies  Outpatient Encounter Prescriptions as of 12/25/2016  Medication Sig  . ranitidine (ZANTAC) 150 MG tablet Take 150 mg by mouth daily.  . [DISCONTINUED] aspirin 325 MG EC tablet Take 650 mg by mouth every 8 (eight) hours as needed for pain.  . [DISCONTINUED] azithromycin (ZITHROMAX) 250 MG tablet 2 PO day 1 then 1 PO qd  . [DISCONTINUED] erythromycin ophthalmic ointment Place a 1/2 inch ribbon of ointment into the lower eyelid 4 times a day.  . [DISCONTINUED] Guaifenesin 1200 MG TB12 Take 1 tablet (1,200 mg total) by mouth 2 (two) times daily.  . [DISCONTINUED] HYDROcodone-acetaminophen (NORCO) 5-325 MG tablet Take 0.5-1 tablets by mouth every 4 (four) hours as needed.  . [DISCONTINUED] predniSONE (DELTASONE) 50 MG tablet Take 1 tablet (50 mg total) by mouth daily.  . [DISCONTINUED] promethazine-dextromethorphan (PROMETHAZINE-DM) 6.25-15 MG/5ML syrup Take 5 mLs by mouth 4 (four) times daily as needed for cough.   No facility-administered encounter  medications on file as of 12/25/2016.     Past Medical History:  Diagnosis Date  . Ulcer (HCC)     peptic ulcer disease at age 33    Past Surgical History:  Procedure Laterality Date  . FACIAL LACERATIONS REPAIR      Social History   Social History  . Marital status: Single    Spouse name: N/A  . Number of children: N/A  . Years of education: N/A   Occupational History  . Not on file.   Social History Main Topics  . Smoking status: Never Smoker  . Smokeless tobacco: Never Used  . Alcohol use Yes     Comment: a few beers and a couple shots daily  . Drug use: No  . Sexual activity: Yes   Other Topics Concern  . Not on file   Social History Narrative   3 YEARS COLLEGE    [employer]   WORK   LANDSCAPER    Family Status  Relation Status  . Mother Alive  . Father Alive  . Sister Alive    Review of Systems Few times woken up gasping for air.  A complete 10 system ROS was obtained and was negative apart from what is mentioned.   Objective:   VITALS:   Vitals:   12/25/16 1314 12/25/16 1413  BP: (!) 190/110 (!) 174/98  Pulse: 94   Weight: 208 lb (94.3 kg)   Height: 5\' 11"  (1.803 m)    Gen:  Appears stated age and in NAD. HEENT:  Normocephalic, atraumatic. The mucous membranes are moist. The superficial temporal arteries are without ropiness or tenderness. Cardiovascular: Regular rate and rhythm. Lungs: Clear to auscultation bilaterally. Neck: There are no carotid bruits noted bilaterally. Skin:  Palms/hands are diaphoretic  NEUROLOGICAL:  Orientation:  The patient is alert and oriented x 3.  Recent and remote memory are intact.  Attention span and concentration are normal.  Able to name objects and repeat without trouble.  Fund of knowledge is appropriate Cranial nerves: There is good facial symmetry. The pupils are equal round and reactive to light bilaterally. Fundoscopic exam reveals clear disc margins bilaterally. Extraocular muscles are intact and  visual fields are full to confrontational testing. Speech is fluent and clear. Soft palate rises symmetrically and there is no tongue deviation. Hearing is intact to conversational tone. Tone: Tone is good throughout. Sensation: Sensation is intact to light touch and pinprick throughout (facial, trunk, extremities). Vibration is intact at the bilateral big toe. There is no extinction with double simultaneous stimulation. There is no sensory dermatomal level identified. Coordination:  The patient has no dysdiadichokinesia or dysmetria. Motor: Strength is 5/5 in the bilateral upper and lower extremities.  Shoulder shrug is equal bilaterally.  There is no pronator drift.  There are no fasciculations noted. DTR's: Deep tendon reflexes are 2/4 at the bilateral biceps, triceps, brachioradialis, patella and achilles.  Plantar responses are downgoing bilaterally. Gait and Station: The patient is able to ambulate without difficulty. The patient is able to heel toe walk without any difficulty. The patient has mild difficulty ambulating in a tandem fashion.  The patient is able to stand in the Romberg position.   MOVEMENT EXAM: Tremor:  There is tremor in the UE, noted most significantly with action.  The patient has tremor with archimedes spirals.    There is no tremor at rest.  The patient is able to pour water from one glass to another without spilling it although tremor is evident.       Assessment/Plan:   1.  Tremor  -discussed role of EtOH in regards to tremor.  Explained that in patients who regularly drink alcohol, both alcohol itself and the withdrawal of it will cause tremor.  I would recommend a weaning program under the care of a primary care physician.  Asked him not to d/c it "cold [place]" due to risk of seizure.    -will do labs today to make sure that not missing anything.    2.  High Blood Pressure  -pts blood pressure was very elevated today.  Talked about consequences of long term  uncontrolled HTN.  Pt needs a primary care physician.    -Told him that if he ever does need a medication for tremor (after alcohol was weaned), then a beta blocker could be of value.  He really wants no medication for tremor anyway.  -He was agreeable to a primary care referral.  Will try to get him to Dr. Lawerance BachBurns.  3.  f/u prn.

## 2016-12-25 ENCOUNTER — Other Ambulatory Visit: Payer: Self-pay

## 2016-12-25 ENCOUNTER — Encounter: Payer: Self-pay | Admitting: Neurology

## 2016-12-25 ENCOUNTER — Ambulatory Visit (INDEPENDENT_AMBULATORY_CARE_PROVIDER_SITE_OTHER): Payer: Self-pay | Admitting: Neurology

## 2016-12-25 VITALS — BP 174/98 | HR 94 | Ht 71.0 in | Wt 208.0 lb

## 2016-12-25 DIAGNOSIS — IMO0002 Reserved for concepts with insufficient information to code with codable children: Secondary | ICD-10-CM

## 2016-12-25 DIAGNOSIS — I10 Essential (primary) hypertension: Secondary | ICD-10-CM

## 2016-12-25 DIAGNOSIS — R251 Tremor, unspecified: Secondary | ICD-10-CM

## 2016-12-25 DIAGNOSIS — F1099 Alcohol use, unspecified with unspecified alcohol-induced disorder: Secondary | ICD-10-CM

## 2016-12-25 LAB — TSH: TSH: 4.11 mIU/L (ref 0.40–4.50)

## 2016-12-25 LAB — COMPREHENSIVE METABOLIC PANEL
ALK PHOS: 71 U/L (ref 40–115)
ALT: 33 U/L (ref 9–46)
AST: 42 U/L — AB (ref 10–35)
Albumin: 4.3 g/dL (ref 3.6–5.1)
BILIRUBIN TOTAL: 0.4 mg/dL (ref 0.2–1.2)
BUN: 14 mg/dL (ref 7–25)
CO2: 25 mmol/L (ref 20–31)
CREATININE: 0.83 mg/dL (ref 0.70–1.33)
Calcium: 9 mg/dL (ref 8.6–10.3)
Chloride: 101 mmol/L (ref 98–110)
Glucose, Bld: 68 mg/dL (ref 65–99)
Potassium: 4.7 mmol/L (ref 3.5–5.3)
SODIUM: 135 mmol/L (ref 135–146)
Total Protein: 7.5 g/dL (ref 6.1–8.1)

## 2016-12-25 NOTE — Patient Instructions (Signed)
1. Your provider has requested that you have labwork completed today. Please go to [facility] ([phone]) on the second floor of this building before leaving the office today. You do not need to check in. If you are not called within 15 minutes please check with the front desk.   

## 2016-12-28 LAB — VITAMIN B1: VITAMIN B1 (THIAMINE): 9 nmol/L (ref 8–30)

## 2016-12-29 ENCOUNTER — Telehealth: Payer: Self-pay | Admitting: Neurology

## 2016-12-29 ENCOUNTER — Telehealth: Payer: Self-pay | Admitting: General Practice

## 2016-12-29 NOTE — Telephone Encounter (Signed)
Left message on machine for patient to call back.

## 2016-12-29 NOTE — Telephone Encounter (Signed)
Patient called back and he was made aware.  

## 2016-12-29 NOTE — Telephone Encounter (Signed)
Former pt of [facility] in need of a PCP, would you be willing to accept this patient?

## 2016-12-29 NOTE — Telephone Encounter (Signed)
-----   Message from Octaviano Battyebecca S Tat, DO sent at 12/29/2016  7:41 AM EDT ----- Let pt know that liver enzymes just a little bit up and he and I talked about tapering alcohol, so just let him know lab result.  B1 also a tiny low and let him know thiamine supplement (OTC) is recommended.  Dr. Lawerance BachBurns (PCP) will be calling him to set up appt

## 2016-12-30 NOTE — Telephone Encounter (Signed)
Ok that is probably a good idea given the difficulty getting current patients in on a timely manner

## 2016-12-30 NOTE — Telephone Encounter (Signed)
I will see him 

## 2016-12-30 NOTE — Telephone Encounter (Signed)
-----   Message from Pincus SanesStacy J Burns, MD sent at 12/27/2016  8:04 PM EDT ----- I can accept him.    Ladona Ridgelaylor can you call him to set up an appointment with me.   SB ----- Message ----- From: Octaviano Battyebecca S Tat, DO Sent: 12/25/2016   4:06 PM To: Pincus SanesStacy J Burns, MD  Kennyth ArnoldStacy, this patient was sent to me as a VIP patient.  Needs PCP.  [place]  Doesn't have insurance but self pays visits.  BP pretty high today.  Not seen primary care in years.  thx.

## 2016-12-30 NOTE — Telephone Encounter (Signed)
Per Raynelle FanningJulie, this has been forwarded to another provider due to Dr Lawerance BachBurns no longer accepting patients.

## 2016-12-31 NOTE — Telephone Encounter (Signed)
Left message for patient to call and schedule an establish care visit with Dr. Yetta BarreJones. Informed via voicemail that Dr. Lawerance BachBurns is not accepting new patients.

## 2018-02-16 ENCOUNTER — Telehealth: Payer: Self-pay | Admitting: Neurology

## 2018-02-16 NOTE — Telephone Encounter (Signed)
Please call pt.  See that he is following up Friday for "not feeling well."  I want to make sure that this is a tremor issue as last visit we talked about him needing PCP and don't see that he yet has one (at least in our system).  I am making sure that he has a PCP and he has a neuro issue that needs addressed

## 2018-02-16 NOTE — Telephone Encounter (Signed)
Left message on machine for patient to call back.

## 2018-02-18 NOTE — Progress Notes (Signed)
Subjective:   Micheal Carter was seen in consultation in the movement disorder clinic for tremor.  Pt is accompanied by his best friend who supplements the history.  Tremor started approximately 2-3 years ago and involves the bilateral UE; he notes in the R hand more but is R hand more.  Tremor is most noticeable when he is using the hand, especially texting or eating.   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  There is no family hx of tremor.    Affected by caffeine:  Doesn't drink caffeine  Affected by alcohol:  No. (couple beers and few shots) Affected by stress:  Yes.   Affected by fatigue:  No. Spills soup if on spoon:  May or may not Spills glass of liquid if full:  May or may not Affects ADL's (tying shoes, brushing teeth, etc):  No. (but may have trouble trimming beard)  Current/Previously tried tremor medications: n/a  Current medications that may exacerbate tremor:  N/a  02/19/18 update: I have not seen this patient in about 1 year.  When I did see him a year ago, I talked to him about the importance of getting a primary care physician not only for general health and wellness, spent his blood pressure is very elevated last visit.  I had contacted the primary care physician office for him, and they called him back and left a message and it does not appear that he ever contacted them again to schedule the appointment.  He reports today that tremor is good but he wanted to talk about the fact that he is sleepy all the time.  To bed at 9pm.  Awakens at 7am and does not feel rested.  Sleepy still when awakens.  "I crave sleep."  Doesn't snore.  Denies frequent gasping at night.  No bedmate besides dog.   Has had a cough x 4 months.  Not seen anyone for it.  Asks me about "bumps" on the L leg  No Known Allergies  Outpatient Encounter Medications as of 02/19/2018  Medication Sig  . ranitidine (ZANTAC) 150 MG tablet Take 150 mg by mouth daily.   No  facility-administered encounter medications on file as of 02/19/2018.     Past Medical History:  Diagnosis Date  . Ulcer     peptic ulcer disease at age 33    Past Surgical History:  Procedure Laterality Date  . FACIAL LACERATIONS REPAIR      Social History   Socioeconomic History  . Marital status: Single    Spouse name: Not on file  . Number of children: Not on file  . Years of education: Not on file  . Highest education level: Not on file  Occupational History  . Not on file  Social Needs  . Financial resource strain: Not on file  . Food insecurity:    Worry: Not on file    Inability: Not on file  . Transportation needs:    Medical: Not on file    Non-medical: Not on file  Tobacco Use  . Smoking status: Never Smoker  . Smokeless tobacco: Never Used  Substance and Sexual Activity  . Alcohol use: Yes    Comment: a few beers and a couple shots daily  . Drug use: No  . Sexual activity: Yes  Lifestyle  . Physical activity:    Days per week: Not on file    Minutes per session: Not on file  .  Stress: Not on file  Relationships  . Social connections:    Talks on phone: Not on file    Gets together: Not on file    Attends religious service: Not on file    Active member of club or organization: Not on file    Attends meetings of clubs or organizations: Not on file    Relationship status: Not on file  . Intimate partner violence:    Fear of current or ex partner: Not on file    Emotionally abused: Not on file    Physically abused: Not on file    Forced sexual activity: Not on file  Other Topics Concern  . 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 Mother  Alive  . Father  Alive  . Sister x2 Alive    Review of Systems  A complete 10 system ROS was obtained and was negative apart from what is mentioned.   Objective:   VITALS:   Vitals:   02/19/18 1125  BP:  130/70  Pulse: 90  SpO2: 96%  Weight: 173 lb (78.5 kg)  Height:  (1.778 m)   Gen:  Appears stated age and in NAD. HEENT:  Normocephalic, atraumatic. The mucous membranes are moist. The superficial temporal arteries are without ropiness or tenderness. Cardiovascular: Regular rate and rhythm. Lungs: Clear to auscultation bilaterally but he has a very wet cough Neck: There are no carotid bruits noted bilaterally. Skin:  Palms/hands are diaphoretic (same as last visit). Vasc:  Has varicosities in the left leg   NEUROLOGICAL:  Orientation:  The patient is alert and oriented x 3.   Cranial nerves: There is good facial symmetry.  Extraocular muscles are intact and visual fields are full to confrontational testing. Speech is fluent and clear. Soft palate rises symmetrically and there is no tongue deviation. Hearing is intact to conversational tone. Tone: Tone is good throughout. Sensation: Sensation is intact to light touch  Coordination:  The patient has no difficulty with RAM's or FNF bilaterally. Motor: Strength is 5/5 in the bilateral upper and lower extremities.   DTR's: Deep tendon reflexes are 2/4 at the bilateral biceps, triceps,     Chemistry      Component Value Date/Time   NA 135 12/25/2016 1429   K 4.7 12/25/2016 1429   CL 101 12/25/2016 1429   CO2 25 12/25/2016 1429   BUN 14 12/25/2016 1429   CREATININE 0.83 12/25/2016 1429      Component Value Date/Time   CALCIUM 9.0 12/25/2016 1429   ALKPHOS 71 12/25/2016 1429   AST 42 (H) 12/25/2016 1429   ALT 33 12/25/2016 1429   BILITOT 0.4 12/25/2016 1429     Lab Results  Component Value Date   TSH 4.11 12/25/2016       Assessment/Plan:   1.  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  Feels tremor is stable.  2.  EDS  -talked last visit about getting PCP and I even referred him but he didn't call back.  Explained I cannot be his PCP.  States that Dr. Kevan Ny is his neighbor and has agreed to take him on.  Told him he  needed to make appt  -will do labs (chem, cbc) today  -CXR today given cough x 4 months.    -could consider PSG but pt has no insurance so may  be very costly  3.  L leg varicoose veins  -reassured him that this is likely from standing on feet.  Compression stockings may be of value  4.  F/u prn

## 2018-02-19 ENCOUNTER — Encounter: Payer: Self-pay | Admitting: Neurology

## 2018-02-19 ENCOUNTER — Ambulatory Visit
Admission: RE | Admit: 2018-02-19 | Discharge: 2018-02-19 | Disposition: A | Discharge status: Home / Self Care | Payer: No Typology Code available for payment source | Source: Ambulatory Visit | Attending: Neurology | Admitting: Neurology

## 2018-02-19 ENCOUNTER — Ambulatory Visit: Payer: Self-pay | Admitting: Neurology

## 2018-02-19 ENCOUNTER — Other Ambulatory Visit: Payer: Self-pay

## 2018-02-19 VITALS — BP 130/70 | HR 90 | Ht 70.0 in | Wt 173.0 lb

## 2018-02-19 DIAGNOSIS — R05 Cough: Secondary | ICD-10-CM

## 2018-02-19 DIAGNOSIS — I8392 Asymptomatic varicose veins of left lower extremity: Secondary | ICD-10-CM

## 2018-02-19 DIAGNOSIS — R053 Chronic cough: Secondary | ICD-10-CM

## 2018-02-19 DIAGNOSIS — R400 Somnolence: Secondary | ICD-10-CM

## 2018-02-19 LAB — CBC WITH DIFFERENTIAL/PLATELET
BASOS PCT: 1.2 %
Basophils Absolute: 70 cells/uL (ref 0–200)
EOS PCT: 6.4 %
Eosinophils Absolute: 371 cells/uL (ref 15–500)
HCT: 40.2 % (ref 38.5–50.0)
Hemoglobin: 14 g/dL (ref 13.2–17.1)
LYMPHS ABS: 1148 {cells}/uL (ref 850–3900)
MCH: 30.3 pg (ref 27.0–33.0)
MCHC: 34.8 g/dL (ref 32.0–36.0)
MCV: 87 fL (ref 80.0–100.0)
MONOS PCT: 11.3 %
MPV: 9 fL (ref 7.5–12.5)
NEUTROS ABS: 3555 {cells}/uL (ref 1500–7800)
Neutrophils Relative %: 61.3 %
PLATELETS: 266 10*3/uL (ref 140–400)
RBC: 4.62 10*6/uL (ref 4.20–5.80)
RDW: 12.8 % (ref 11.0–15.0)
Total Lymphocyte: 19.8 %
WBC mixed population: 655 cells/uL (ref 200–950)
WBC: 5.8 10*3/uL (ref 3.8–10.8)

## 2018-02-19 LAB — COMPREHENSIVE METABOLIC PANEL
AG Ratio: 1.2 (calc) (ref 1.0–2.5)
ALKALINE PHOSPHATASE (APISO): 70 U/L (ref 40–115)
ALT: 25 U/L (ref 9–46)
AST: 21 U/L (ref 10–35)
Albumin: 4.1 g/dL (ref 3.6–5.1)
BILIRUBIN TOTAL: 0.7 mg/dL (ref 0.2–1.2)
BUN: 10 mg/dL (ref 7–25)
CALCIUM: 9.4 mg/dL (ref 8.6–10.3)
CO2: 28 mmol/L (ref 20–32)
Chloride: 103 mmol/L (ref 98–110)
Creat: 0.92 mg/dL (ref 0.70–1.33)
Globulin: 3.4 g/dL (calc) (ref 1.9–3.7)
Glucose, Bld: 99 mg/dL (ref 65–99)
Potassium: 4.4 mmol/L (ref 3.5–5.3)
SODIUM: 139 mmol/L (ref 135–146)
TOTAL PROTEIN: 7.5 g/dL (ref 6.1–8.1)

## 2018-02-19 NOTE — Patient Instructions (Signed)
1. Your provider has requested that you have labwork completed today. Please go to [facility] ([phone]) on the second floor of this building before leaving the office today. You do not need to check in. If you are not called within 15 minutes please check with the front desk.   2. After you have labs, you can go to [facility] on the first floor for chest xray.

## 2018-02-22 ENCOUNTER — Telehealth: Payer: Self-pay | Admitting: Neurology

## 2018-02-22 DIAGNOSIS — R911 Solitary pulmonary nodule: Secondary | ICD-10-CM

## 2018-02-22 NOTE — Telephone Encounter (Signed)
Jade, please let pt know I reviewed CXR and that he likely has a pneumonia on the right and likely why he has the cough.  He also has a pulmonary nodule that needs f/u.  He needs to go to UC today to get tx for the pneumonia since he has no pcp and this is WAY out of my field of expertise.  Refer to pulmonology for the nodule

## 2018-02-22 NOTE — Telephone Encounter (Signed)
Patient called back and was made aware. Referral sent to Pulmonary. He states he will see urgent care today.

## 2018-02-22 NOTE — Telephone Encounter (Signed)
Left message on machine for patient to call back.

## 2018-02-23 ENCOUNTER — Emergency Department (HOSPITAL_COMMUNITY)
Admission: EM | Admit: 2018-02-23 | Discharge: 2018-02-23 | Disposition: A | Discharge status: Home / Self Care | Payer: Self-pay | Attending: Emergency Medicine | Admitting: Emergency Medicine

## 2018-02-23 ENCOUNTER — Encounter (HOSPITAL_COMMUNITY): Payer: Self-pay | Admitting: Emergency Medicine

## 2018-02-23 DIAGNOSIS — J189 Pneumonia, unspecified organism: Secondary | ICD-10-CM | POA: Insufficient documentation

## 2018-02-23 DIAGNOSIS — Z79899 Other long term (current) drug therapy: Secondary | ICD-10-CM | POA: Insufficient documentation

## 2018-02-23 DIAGNOSIS — F909 Attention-deficit hyperactivity disorder, unspecified type: Secondary | ICD-10-CM | POA: Insufficient documentation

## 2018-02-23 DIAGNOSIS — J181 Lobar pneumonia, unspecified organism: Secondary | ICD-10-CM

## 2018-02-23 DIAGNOSIS — G20 Parkinson's disease: Secondary | ICD-10-CM | POA: Insufficient documentation

## 2018-02-23 DIAGNOSIS — F1010 Alcohol abuse, uncomplicated: Secondary | ICD-10-CM | POA: Insufficient documentation

## 2018-02-23 HISTORY — DX: Parkinson's disease without dyskinesia, without mention of fluctuations: G20.A1

## 2018-02-23 HISTORY — DX: Parkinson's disease: G20

## 2018-02-23 MED ORDER — AMOXICILLIN-POT CLAVULANATE 875-125 MG PO TABS: 1.0000 | ORAL_TABLET | Freq: Two times a day (BID) | ORAL | 0 refills | Status: AC

## 2018-02-23 NOTE — ED Provider Notes (Signed)
[facility]-EMERGENCY DEPT Provider Note   CSN: [id number] Arrival date & time: 02/23/18  1406     History   Chief Complaint Chief Complaint  Patient presents with  . Cough    HPI Micheal Carter is a 57 y.o. male with past medical history of Parkinson's disease, presenting to the ED with positive chest x-ray for pneumonia.  Patient states he was evaluated by his neurologist, and complained of intermittent cough x4 months.  He states he had a chest x-ray done which showed pneumonia, and he was also told he had a lung nodule for which he will be referred to pulmonology for follow-up.  He states his cough has not been significantly productive, though has been feeling ill lately with subjective fevers.  Denies difficulty breathing or swallowing, hx of immunocompromise. No recent abx or hospital admissions.  The history is provided by the patient.    Past Medical History:  Diagnosis Date  . Parkinson's disease (HCC)   . Ulcer     peptic ulcer disease at age 78    Patient Active Problem List   Diagnosis Date Noted  . Alcohol abuse 06/03/2012  . PEPTIC ULCER DISEASE, HX OF 07/03/2009  . ATTENTION DEFICIT DISORDER, ADULT 07/02/2009    Past Surgical History:  Procedure Laterality Date  . FACIAL LACERATIONS REPAIR          Home Medications    Prior to Admission medications   Medication Sig Start Date End Date Taking? Authorizing Provider  ranitidine (ZANTAC) 150 MG tablet Take 150 mg by mouth daily.   Yes [provider]  amoxicillin-clavulanate (AUGMENTIN) 875-125 MG tablet Take 1 tablet by mouth every 12 (twelve) hours for 10 days. 02/23/18 03/05/18  Chidera Thivierge, [place] N, PA-C    Family History Family History  Problem Relation Age of Onset  . Alcohol abuse Mother   . Alcohol abuse Father   . Heart disease Father   . Mental illness Father        bipolar disease    Social History Social History   Tobacco Use  . Smoking status: Never  Smoker  . Smokeless tobacco: Never Used  Substance Use Topics  . Alcohol use: Yes    Comment: a few beers and a couple shots daily  . Drug use: No     Allergies   Patient has no known allergies.   Review of Systems Review of Systems  Constitutional: Positive for fever.  Respiratory: Positive for cough. Negative for shortness of breath and stridor.   Allergic/Immunologic: Negative for immunocompromised state.  All other systems reviewed and are negative.    Physical Exam Updated Vital Signs BP 121/80 (BP Location: Left Arm)   Pulse 87   Temp 99.5 F (37.5 C) (Oral)   Resp 18   SpO2 97%   Physical Exam  Constitutional: He appears well-developed and well-nourished. No distress.  HENT:  Head: Normocephalic and atraumatic.  Right Ear: Tympanic membrane and ear canal normal.  Left Ear: Tympanic membrane and ear canal normal.  Mouth/Throat: Oropharynx is clear and moist.  Eyes: Conjunctivae are normal.  Neck: Normal range of motion. Neck supple.  Cardiovascular: Normal rate, regular rhythm and intact distal pulses.  Pulmonary/Chest: Effort normal. No stridor. No respiratory distress. He has no wheezes.  slightly diminished RLL  Abdominal: Soft.  Lymphadenopathy:    He has no cervical adenopathy.  Neurological: He is alert.  Skin: Skin is warm.  Psychiatric: He has a normal mood and affect.  His behavior is normal.  Nursing note and vitals reviewed.    ED Treatments / Results  Labs (all labs ordered are listed, but only abnormal results are displayed) Labs Reviewed - No data to display  EKG None  Radiology No results found.  CLINICAL DATA:  Chronic cough and congestion.  EXAM: CHEST - 2 VIEW  COMPARISON:  12/07/2014 and prior chest radiographs  FINDINGS: The cardiomediastinal silhouette is unremarkable.  A 1 cm nodular opacity overlying the RIGHT UPPER lobe noted.  Mild RIGHT LOWER lobe airspace disease noted.  Mild peribronchial thickening  again noted.  There is no evidence of pleural effusion or pneumothorax.  No acute bony abnormalities are identified.  IMPRESSION: Mild RIGHT LOWER lobe airspace disease likely representing pneumonia. Radiographic follow-up to resolution recommended.  1 cm RIGHT UPPER lobe nodular opacity suspicious for pulmonary nodule. Chest CT with contrast is recommended for further evaluation.  Electronically Signed   By: Harmon Pier M.D.   On: 02/19/2018 16:27  Procedures Procedures (including critical care time)  Medications Ordered in ED Medications - No data to display   Initial Impression / Assessment and Plan / ED Course  I have reviewed the triage vital signs and the nursing notes.  Pertinent labs & imaging results that were available during my care of the patient were reviewed by me and considered in my medical decision making (see chart for details).     Patient has been diagnosed with CAP via chest xray that was done on 02/19/18 showing right lower lobe consolidation. Pt also aware of RUL nodule and has follow up with pulmonology.  Pt is not ill appearing, immunocompromised, and does not have multiple co morbidities, therefore I feel like the they can be treated as an OP with abx therapy. Pt has been advised to return to the ED if symptoms worsen or they do not improve. Pt verbalizes understanding and is agreeable with plan.   Discussed results, findings, treatment and follow up. Patient advised of return precautions. Patient verbalized understanding and agreed with plan.  Final Clinical Impressions(s) / ED Diagnoses   Final diagnoses:  Community acquired pneumonia of right lower lobe of lung [facility])    ED Discharge Orders        Ordered    amoxicillin-clavulanate (AUGMENTIN) 875-125 MG tablet  Every 12 hours     02/23/18 1519       Silver Parkey, [place] N, PA-C 02/23/18 1520    Rolland Porter, MD 02/27/18 1147

## 2018-02-23 NOTE — Discharge Instructions (Addendum)
Please read instructions below. Take the antibiotic as prescribed until it is completely gone. Return to the ED if your symptoms worsen or do not improve following the antibiotics, if you develop shortness or breath, or new or concerning symptoms.

## 2018-02-23 NOTE — ED Triage Notes (Signed)
Per pt, states he has had a cough for 4 months-states he sees neurologist for his Parkinson's-states he had a chest xray which showed PNA-was told to come to ED for treatment

## 2018-03-09 ENCOUNTER — Telehealth: Payer: Self-pay | Admitting: Neurology

## 2018-03-09 NOTE — Telephone Encounter (Signed)
Patient called needing to see if he needs a referral from our office to a Pulmonologist? Please Call. Thanks

## 2018-03-10 NOTE — Telephone Encounter (Signed)
Patient made aware we already sent referral to [facility] on 02/22/18. He was given the number to contact them directly. [phone].

## 2018-04-13 ENCOUNTER — Encounter: Payer: Self-pay | Admitting: Pulmonary Disease

## 2018-04-13 ENCOUNTER — Ambulatory Visit (INDEPENDENT_AMBULATORY_CARE_PROVIDER_SITE_OTHER): Payer: Self-pay | Admitting: Pulmonary Disease

## 2018-04-13 ENCOUNTER — Telehealth: Payer: Self-pay | Admitting: Pulmonary Disease

## 2018-04-13 DIAGNOSIS — J181 Lobar pneumonia, unspecified organism: Secondary | ICD-10-CM

## 2018-04-13 DIAGNOSIS — J189 Pneumonia, unspecified organism: Secondary | ICD-10-CM

## 2018-04-13 DIAGNOSIS — R911 Solitary pulmonary nodule: Secondary | ICD-10-CM

## 2018-04-13 NOTE — Telephone Encounter (Signed)
Pharm is [facility] on [address].  Patient does not have PCP.

## 2018-04-13 NOTE — Telephone Encounter (Signed)
Patient seen 04/13/18 for pulm consult with Dr Vassie LollAlva: He reports that he was walking his dog last night and had an accidental fall on his tailbone which is hurting.  He does not want any testing for this today.  He states that he does not really like pain medications.  I note that he does not have a PCP    LMOM TCB x1

## 2018-04-13 NOTE — Assessment & Plan Note (Signed)
Incidental finding on chest x-ray although faint opacity noted on prior x-ray from 12/2014 Schedule CT chest with IV contrast for right upper lobe nodule.  Follow-up based on results

## 2018-04-13 NOTE — Progress Notes (Signed)
Subjective:    Patient ID: Micheal Carter, male    DOB: [birth year], 57 y.o.   MRN: [id number]  HPI  Chief Complaint  Patient presents with  . Pulm Consult    Self referral for pulmonary nodule discovered via CXR back in May 2019. CXR was ordered by Dr. Arbutus Leas. Denies any current breathing concerns.    57 year old never smoker, referred by neurology for evaluation of incidental finding of nodule in the right upper lobe on chest x-ray. He has Parkinson's and follows with neurology, diagnosed in 12/2016, not on any medications.  His main complaint is tremors.  He saw neurology on 5/17 and complained of a dry cough for about 4 months.  He does not have a PCP, chest x-ray was obtained which showed right lower lobe opacity and a 1 cm nodule in the right upper lobe.  I have personally reviewed this at home.  On his prior film from 12/2014, no mention was made in the report of a nodule but there is a faint opacity in the right upper lobe.  This is not present on the film from 2014. He denies fevers or URI symptoms at that time.  He was then sent to urgent care and given Augmentin for 7 days.  He states that this did not really improve his cough and his cough is still persistent.  He denies any diurnal variation of cough although exertion makes it worse and rest makes it better. 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 He denies chest pain or hemoptysis.  He reports a 20 pound weight loss over the last year and a half which she states is intentional since he started eating healthy solids.  He lives by himself. He drinks 6 beers every night.  He reports that he was walking his dog last night and had an accidental fall on his tailbone which is hurting.  He does not want any testing for this today.  He states that he does not really like pain medications.  I note that he does not have a PCP  He reports occasional dysphagia but not particularly to solids or  liquids.  Drinks 6 beers every night but does not remember passing out or ever having the shakes  There is a history of emphysema in a great uncle but no family history of lung cancer    Past Medical History:  Diagnosis Date  . Parkinson's disease (HCC)   . Ulcer     peptic ulcer disease at age 35    Past Surgical History:  Procedure Laterality Date  . FACIAL LACERATIONS REPAIR      No Known Allergies  Social History   Socioeconomic History  . Marital status: Single    Spouse name: Not on file  . Number of children: Not on file  . Years of education: Not on file  . Highest education level: Not on file  Occupational History  . Not on file  Social Needs  . Financial resource strain: Not on file  . Food insecurity:    Worry: Not on file    Inability: Not on file  . Transportation needs:    Medical: Not on file    Non-medical: Not on file  Tobacco Use  . Smoking status: Never Smoker  . Smokeless tobacco: Never Used  Substance and Sexual Activity  . Alcohol use: Yes    Comment: a few beers and a couple  shots daily  . Drug use: No  . Sexual activity: Yes  Lifestyle  . Physical activity:    Days per week: Not on file    Minutes per session: Not on file  . Stress: Not on file  Relationships  . Social connections:    Talks on phone: Not on file    Gets together: Not on file    Attends religious service: Not on file    Active member of club or organization: Not on file    Attends meetings of clubs or organizations: Not on file    Relationship status: Not on file  . Intimate partner violence:    Fear of current or ex partner: Not on file    Emotionally abused: Not on file    Physically abused: Not on file    Forced sexual activity: Not on file  Other Topics Concern  . Not on file  Social History Narrative   3 YEARS COLLEGE    [employer]   WORK   LANDSCAPER     Family History  Problem Relation Age of Onset  . Alcohol abuse Mother   . Alcohol abuse Father    . Heart disease Father   . Mental illness Father        bipolar disease     Review of Systems  Constitutional: Positive for appetite change. Negative for fever and unexpected weight change.  HENT: Negative for congestion, dental problem, ear pain, nosebleeds, postnasal drip, rhinorrhea, sinus pressure, sneezing, sore throat and trouble swallowing.   Eyes: Negative for redness and itching.  Respiratory: Positive for cough and shortness of breath. Negative for chest tightness and wheezing.   Cardiovascular: Negative for palpitations and leg swelling.  Gastrointestinal: Negative for nausea and vomiting.  Genitourinary: Negative for dysuria.  Musculoskeletal: Negative for joint swelling.  Skin: Negative for rash.  Allergic/Immunologic: Negative.  Negative for environmental allergies, food allergies and immunocompromised state.  Neurological: Negative for headaches.  Hematological: Does not bruise/bleed easily.  Psychiatric/Behavioral: Positive for dysphoric mood. The patient is not nervous/anxious.        Objective:   Physical Exam  Gen. Pleasant, well-nourished, in no distress, normal affect ENT - white beard, no post nasal drip Neck: No JVD, no thyromegaly, no carotid bruits Lungs: no use of accessory muscles, no dullness to percussion, clear without rales or rhonchi  Cardiovascular: Rhythm regular, heart sounds  normal, no murmurs or gallops, no peripheral edema Abdomen: soft and non-tender, no hepatosplenomegaly, BS normal. Musculoskeletal: No deformities, no cyanosis or clubbing, mild tenderness tailbone Neuro:  alert, non focal       Assessment & Plan:

## 2018-04-13 NOTE — Assessment & Plan Note (Signed)
Noted in the right lower lobe, associated with dry cough for 4 months. Aspiration remains a possibility due to his history of drinking as well as mild dysphagia that he reports and attributes to Parkinson's although he does not describe any choking episodes

## 2018-04-13 NOTE — Patient Instructions (Signed)
Schedule CT chest with IV contrast for right upper lobe nodule.  Follow-up based on results of test

## 2018-04-15 NOTE — Telephone Encounter (Signed)
Use Advil twice daily with food for 2 days and if still has pain then call back

## 2018-04-15 NOTE — Telephone Encounter (Signed)
Called and spoke with patient, advised him of RA response. Patient verbalized understanding. Nothing further needed.

## 2018-04-15 NOTE — Telephone Encounter (Signed)
Spoke with patient. He stated that he was seen by RA on 04/13/18. RA offered him a pain medication during visit due to his tailbone injury but he declined due to the pain not being that bad. Now he states the pain is unbearable. He is unable to bend and sit for long periods of time.   He wishes to have something called in for him. Patient wishes to use [facility] [address].   RA, please advise. Thanks!

## 2018-04-19 ENCOUNTER — Ambulatory Visit (INDEPENDENT_AMBULATORY_CARE_PROVIDER_SITE_OTHER)
Admission: RE | Admit: 2018-04-19 | Discharge: 2018-04-19 | Disposition: A | Discharge status: Home / Self Care | Payer: Self-pay | Source: Ambulatory Visit | Attending: Pulmonary Disease | Admitting: Pulmonary Disease

## 2018-04-19 DIAGNOSIS — R911 Solitary pulmonary nodule: Secondary | ICD-10-CM

## 2018-04-19 MED ORDER — IOPAMIDOL (ISOVUE-300) INJECTION 61%
80.0000 mL | Freq: Once | INTRAVENOUS | Status: AC | PRN
  Administered 2018-04-19: 80 mL via INTRAVENOUS

## 2018-04-20 ENCOUNTER — Other Ambulatory Visit: Payer: Self-pay | Admitting: *Deleted

## 2018-04-20 DIAGNOSIS — R911 Solitary pulmonary nodule: Secondary | ICD-10-CM

## 2018-08-24 ENCOUNTER — Ambulatory Visit (INDEPENDENT_AMBULATORY_CARE_PROVIDER_SITE_OTHER)
Admission: RE | Admit: 2018-08-24 | Discharge: 2018-08-24 | Disposition: A | Discharge status: Home / Self Care | Payer: Self-pay | Source: Ambulatory Visit | Attending: Pulmonary Disease | Admitting: Pulmonary Disease

## 2018-08-24 DIAGNOSIS — R911 Solitary pulmonary nodule: Secondary | ICD-10-CM

## 2018-09-14 ENCOUNTER — Ambulatory Visit (INDEPENDENT_AMBULATORY_CARE_PROVIDER_SITE_OTHER): Payer: Self-pay | Admitting: Pulmonary Disease

## 2018-09-14 ENCOUNTER — Encounter: Payer: Self-pay | Admitting: Pulmonary Disease

## 2018-09-14 DIAGNOSIS — R911 Solitary pulmonary nodule: Secondary | ICD-10-CM

## 2018-09-14 DIAGNOSIS — R05 Cough: Secondary | ICD-10-CM

## 2018-09-14 DIAGNOSIS — R053 Chronic cough: Secondary | ICD-10-CM

## 2018-09-14 NOTE — Progress Notes (Signed)
   Subjective:    Patient ID: Micheal Carter, male    DOB: 24-May-1961, 57 y.o.   MRN: [id number]  HPI  57 yo never smoker, referred by neurology for FU of incidental finding of nodule in the right upper lobe on chest x-ray. He has Parkinson's and follows with neurology, diagnosed in 12/2016 He has tremors does not take any medications.  He has a history of alcohol abuse.  We reviewed CT chest from 08/2018 which shows slight increase in size of right upper lobe nodule from 6 to 7 mm. He also reports a dry intermittent cough especially in the mornings for the last year.  He denies obvious sinus drip.  He does have heartburn for which he takes the over-the-counter Zantac for many years  Significant tests/ events reviewed  CT chest 04/2018 6 mm noncalcified RIGHT upper lobe nodule corresponding to chest radiograph finding. Additional 6 mm diameter nodule at minor fissure . 3 mm RIGHT upper lobe nodule    Review of Systems Patient denies significant dyspnea,cough, hemoptysis,  chest pain, palpitations, pedal edema, orthopnea, paroxysmal nocturnal dyspnea, lightheadedness, nausea, vomiting, abdominal or  leg pains      Objective:   Physical Exam  Gen. Pleasant, well-nourished, in no distress ENT - no thrush, no post nasal drip Neck: No JVD, no thyromegaly, no carotid bruits Lungs: no use of accessory muscles, no dullness to percussion, clear without rales or rhonchi  Cardiovascular: Rhythm regular, heart sounds  normal, no murmurs or gallops, no peripheral edema Musculoskeletal: No deformities, no cyanosis or clubbing  Tremors + , no cog wheel rigidity       Assessment & Plan:

## 2018-09-14 NOTE — Assessment & Plan Note (Signed)
Appears to be related to sinus drip or reflux. I have asked him to double up on his famotidine for 4 weeks to see if he has relief from cough

## 2018-09-14 NOTE — Patient Instructions (Signed)
CT chest without contrast in 6 months.  Increase famotidine twice daily for reflux

## 2018-09-14 NOTE — Assessment & Plan Note (Signed)
Right upper lobe posterior segment nodule has increased slightly in size from 6 to 7 mm  This needs to be followed up again in 6 months, although he is a non-smoker

## 2020-02-18 ENCOUNTER — Ambulatory Visit: Payer: Self-pay | Attending: Internal Medicine

## 2020-02-18 DIAGNOSIS — Z23 Encounter for immunization: Secondary | ICD-10-CM

## 2020-02-18 NOTE — Progress Notes (Signed)
   Covid-19 Vaccination Clinic  Name:  Micheal Carter    MRN: [id number] DOB: [birth year]  02/18/2020  Mr. Micheal Carter was observed post Covid-19 immunization for 15 minutes without incident. He was provided with Vaccine Information Sheet and instruction to access the V-Safe system.   Mr. Micheal Carter was instructed to call 911 with any severe reactions post vaccine: Marland Kitchen Difficulty breathing  . Swelling of face and throat  . A fast heartbeat  . A bad rash all over body  . Dizziness and weakness   Immunizations Administered    Name Date Dose VIS Date Route   Pfizer COVID-19 Vaccine 02/18/2020 12:07 PM 0.3 mL 11/30/2018 Intramuscular   Manufacturer: [employer], [employer]   Lot: [id number]   NDC: [address]-4

## 2020-06-29 ENCOUNTER — Other Ambulatory Visit: Payer: Self-pay

## 2020-06-29 ENCOUNTER — Ambulatory Visit (INDEPENDENT_AMBULATORY_CARE_PROVIDER_SITE_OTHER): Payer: Self-pay | Admitting: Family

## 2020-06-29 ENCOUNTER — Encounter: Payer: Self-pay | Admitting: Family

## 2020-06-29 VITALS — BP 150/98 | HR 104 | Temp 98.5°F | Ht 69.0 in | Wt 210.0 lb

## 2020-06-29 DIAGNOSIS — R053 Chronic cough: Secondary | ICD-10-CM

## 2020-06-29 DIAGNOSIS — Z23 Encounter for immunization: Secondary | ICD-10-CM

## 2020-06-29 DIAGNOSIS — R918 Other nonspecific abnormal finding of lung field: Secondary | ICD-10-CM

## 2020-06-29 DIAGNOSIS — R05 Cough: Secondary | ICD-10-CM

## 2020-06-29 NOTE — Progress Notes (Signed)
Micheal Carter is a 59 y.o. male with the following history as recorded in EpicCare:  Patient Active Problem List   Diagnosis Date Noted  . Chronic cough 09/14/2018  . Pulmonary nodule 04/13/2018  . Alcohol abuse 06/03/2012  . PEPTIC ULCER DISEASE, HX OF 07/03/2009  . ATTENTION DEFICIT DISORDER, ADULT 07/02/2009    Current Outpatient Medications  Medication Sig Dispense Refill  . OVER THE COUNTER MEDICATION Take 1 tablet by mouth daily as needed (fever). Pain killer    . cimetidine (TAGAMET) 200 MG tablet Take 200 mg by mouth 2 (two) times daily.     No current facility-administered medications for this visit.    Allergies: Patient has no known allergies.  Past Medical History:  Diagnosis Date  . Parkinson's disease (HCC)   . Ulcer     peptic ulcer disease at age 62    Past Surgical History:  Procedure Laterality Date  . FACIAL LACERATIONS REPAIR      Family History  Problem Relation Age of Onset  . Alcohol abuse Mother   . Alcohol abuse Father   . Heart disease Father   . Mental illness Father        bipolar disease    Social History   Tobacco Use  . Smoking status: Never Smoker  . Smokeless tobacco: Never Used  Substance Use Topics  . Alcohol use: Yes    Comment: a few beers and a couple shots daily    Subjective:  Presents as a new patient; has been under care of pulmonology for chronic cough/concerning lung nodule; last seen there in 08/2018 and repeat CT was recommended in 03/2019; patient appears to have been lost to follow-up- he notes he did not think he could see pulmonology last year due to COVID;  Would like to get his CT updated and referral back to his pulmonologist;  Notes he does not want to do any labs or discuss any other issues today; No prior history of hypertension- does not check regularly; feels it is elevated today because he is in the office;     Objective:  Vitals:   06/29/20 0905  BP: (!) 150/98  Pulse: (!) 104  Temp: 98.5 F (36.9  C)  TempSrc: Oral  SpO2: 96%  Weight: 210 lb (95.3 kg)  Height: 5\' 9"  (1.753 m)    General: Well developed, well nourished, in no acute distress  Skin : Warm and dry.  Head: Normocephalic and atraumatic  Lungs: Respirations unlabored; clear to auscultation bilaterally without wheeze, rales, rhonchi  CVS exam: normal rate and regular rhythm.  Neurologic: Alert and oriented; speech intact; face symmetrical; moves all extremities well; CNII-XII intact without focal deficit   Assessment:  1. Abnormal CT scan of lung   2. Chronic cough   3. Needs flu shot     Plan:  Concerning changes were noted on his CT in 08/2018- unfortunately he did not follow-up as requested; would like to get this set up now;  Referrals updated as requested;  Discussed elevated blood pressure- he is encouraged to start checking blood pressure regularly and follow-up if blood pressure numbers are greater than 140/90.  Patient defers labs or discussion of other health preventive needs;   This visit occurred during the SARS-CoV-2 public health emergency.  Safety protocols were in place, including screening questions prior to the visit, additional usage of staff PPE, and extensive cleaning of exam room while observing appropriate contact time as indicated for disinfecting solutions.  No follow-ups on file.  Orders Placed This Encounter  Procedures  . CT Chest Wo Contrast    Standing Status:   Future    Standing Expiration Date:   06/29/2021    Order Specific Question:   Preferred imaging location?    Answer:   [id number]    Order Specific Question:   Radiology Contrast Protocol - do NOT remove file path    Answer:   [email or web address]  . Flu Vaccine QUAD 36+ mos IM  . Ambulatory referral to Pulmonology    Referral Priority:   Routine    Referral Type:   Consultation    Referral Reason:   Specialty Services Required    Referred to Provider:   Oretha Milch,  MD    Requested Specialty:   Pulmonary Disease    Number of Visits Requested:   1    Requested Prescriptions    No prescriptions requested or ordered in this encounter

## 2020-07-12 ENCOUNTER — Other Ambulatory Visit: Payer: Self-pay

## 2020-07-12 ENCOUNTER — Ambulatory Visit
Admission: RE | Admit: 2020-07-12 | Discharge: 2020-07-12 | Disposition: A | Discharge status: Home / Self Care | Payer: Self-pay | Source: Ambulatory Visit | Attending: Family | Admitting: Family

## 2020-07-12 DIAGNOSIS — R053 Chronic cough: Secondary | ICD-10-CM

## 2020-07-12 DIAGNOSIS — R918 Other nonspecific abnormal finding of lung field: Secondary | ICD-10-CM

## 2020-07-13 ENCOUNTER — Other Ambulatory Visit: Payer: Self-pay | Admitting: Family

## 2020-07-13 DIAGNOSIS — K8020 Calculus of gallbladder without cholecystitis without obstruction: Secondary | ICD-10-CM

## 2020-08-06 ENCOUNTER — Encounter: Payer: Self-pay | Admitting: Adult Health

## 2020-08-06 ENCOUNTER — Ambulatory Visit: Payer: Self-pay | Admitting: Pulmonary Disease

## 2020-08-06 ENCOUNTER — Other Ambulatory Visit: Payer: Self-pay

## 2020-08-06 ENCOUNTER — Ambulatory Visit (INDEPENDENT_AMBULATORY_CARE_PROVIDER_SITE_OTHER): Payer: Self-pay | Admitting: Adult Health

## 2020-08-06 VITALS — BP 130/70 | HR 77 | Temp 97.7°F | Ht 70.0 in | Wt 209.8 lb

## 2020-08-06 DIAGNOSIS — R053 Chronic cough: Secondary | ICD-10-CM

## 2020-08-06 DIAGNOSIS — N62 Hypertrophy of breast: Secondary | ICD-10-CM

## 2020-08-06 DIAGNOSIS — R911 Solitary pulmonary nodule: Secondary | ICD-10-CM

## 2020-08-06 NOTE — Assessment & Plan Note (Addendum)
Chronic cough in a never smoker.  Questionable etiology may be a component of GERD plus or minus chronic rhinitis.  Patient does have underlying Parkinson's so dysphagia may be contributing factor as well. No evidence of any pneumonias or acute pulmonary issues on recent CT chest. There is some notable emphysema and a never smoker.  He does have secondhand smoke exposure.  Will check PFTs on return. We will control for GERD and postnasal drainage.  Plan  Patient Instructions  Continue on Pepcid 20mg   Twice daily   Add Claritin 10mg  daily  Add Deslym 2 tsp Twice daily  For cough As needed   Set up CT chest in   1 year (lung nodule)  Follow up with Primary provider for breast tenderness.  Follow up with Dr.  In 3 months with PFT and As needed   Please contact office for sooner follow up if symptoms do not improve or worsen or seek emergency care

## 2020-08-06 NOTE — Assessment & Plan Note (Signed)
Left-sided gynecomasty with positive left-sided breast tenderness. Patient is advised to follow-up with a primary care provider and decide if additional testing such as diagnostic breast ultrasound is indicated

## 2020-08-06 NOTE — Progress Notes (Signed)
@Patient  ID: , male    DOB: 1960/11/02, 59 y.o.   MRN: [id number]  Chief Complaint  Patient presents with  . Follow-up    Lung nodule     Referring provider: [id number],*  HPI: 59 year old male never smoker followed for lung nodule and chronic cough. Medical history significant for Parkinson's disease followed by neurology.  And alcohol abuse history.  TEST/EVENTS :  CT chest July 12, 2020 stable 7.5 mm right upper lobe lung nodule-, there was slightly spiculated margins but stable over a 2-year period .  Stable emphysematous changes.  No mediastinal or hilar adenopathy  08/06/2020 Follow up : Lung nodule  Patient presents for a follow-up.  Last seen December 2019.  Patient is a never smoker.  He had a CT chest that was done November 2019 which showed a right upper lobe lung nodule considered an incidental finding.  He has had serial CT scans.  Most recent CT chest July 12, 2020 showed a stable 7.5 mm right upper lobe nodule.  There were some slightly spiculated margins.  Recommendations are to repeat a CT chest in 1 year. Denies any hemoptysis, unintentional weight loss.  Patient has a known chronic cough.  Felt to be related to possible postnasal drainage and/or GERD.  He was recommended to use Pepcid twice daily. Currently not taking any meds.  Occasional swallow issues.  No significant sinus drainage. Cough dry and comes and goes.  He is never smoker, but lots of second hand smoke exposure.  Landscaper.   Patient does complain of some left breast discomfort tenderness.  No nipple discharge. Of note CT chest showed some gynecomastia.  No Known Allergies  Immunization History  Administered Date(s) Administered  . Influenza,inj,Quad PF,6+ Mos 06/29/2020  . PFIZER SARS-COV-2 Vaccination 02/18/2020, 03/12/2020  . Tdap 02/21/2013    Past Medical History:  Diagnosis Date  . Parkinson's disease (HCC)   . Ulcer     peptic ulcer disease at age  77    Tobacco History: Social History   Tobacco Use  Smoking Status Never Smoker  Smokeless Tobacco Never Used   Counseling given: Not Answered   Outpatient Medications Prior to Visit  Medication Sig Dispense Refill  . cimetidine (TAGAMET) 200 MG tablet Take 200 mg by mouth 2 (two) times daily.    15 OVER THE COUNTER MEDICATION Take 1 tablet by mouth daily as needed (fever). Pain killer     No facility-administered medications prior to visit.     Review of Systems:   Constitutional:   No  weight loss, night sweats,  Fevers, chills, fatigue, or  lassitude.  HEENT:   No headaches,  Difficulty swallowing,  Tooth/dental problems, or  Sore throat,                No sneezing, itching, ear ache,  +nasal congestion, post nasal drip,   CV:  No chest pain,  Orthopnea, PND, swelling in lower extremities, anasarca, dizziness, palpitations, syncope.   GI  No heartburn, indigestion, abdominal pain, nausea, vomiting, diarrhea, change in bowel habits, loss of appetite, bloody stools.   Resp:  .  No chest wall deformity  Skin: no rash or lesions.  GU: no dysuria, change in color of urine, no urgency or frequency.  No flank pain, no hematuria   MS:  No joint pain or swelling.  No decreased range of motion.  No back pain.    Physical Exam  BP 130/70   Pulse 77  Temp 97.7 F (36.5 C) (Oral)   Ht 5\' 10"  (1.778 m)   Wt 209 lb 12.8 oz (95.2 kg)   SpO2 96%   BMI 30.10 kg/m   GEN: A/Ox3; pleasant , NAD, well nourished    HEENT:  [place]/AT,    NOSE-clear, THROAT-clear, no lesions, no postnasal drip or exudate noted.   NECK:  Supple w/ fair ROM; no JVD; normal carotid impulses w/o bruits; no thyromegaly or nodules palpated; no lymphadenopathy.    RESP  Clear  P & A; w/o, wheezes/ rales/ or rhonchi. no accessory muscle use, no dullness to percussion  CARD:  RRR, no m/r/g, no peripheral edema, pulses intact, no cyanosis or clubbing.  GI:   Soft & nt; nml bowel sounds; no organomegaly  or masses detected.   Musco: Warm bil, no deformities or joint swelling noted.   Neuro: alert, no focal deficits noted.    Skin: Warm, no lesions or rashes    Lab Results:  CBC  BNP No results found for: BNP  ProBNP No results found for: PROBNP  Imaging: CT Chest Wo Contrast  Result Date: 07/12/2020 CLINICAL DATA:  Followup pulmonary nodule. EXAM: CT CHEST WITHOUT CONTRAST TECHNIQUE: Multidetector CT imaging of the chest was performed following the standard protocol without IV contrast. COMPARISON:  CT scan 08/24/2018 in 04/19/2018 FINDINGS: Cardiovascular: The heart is normal in size. No pericardial effusion. The aorta demonstrates mild stable tortuosity and ectasia but no focal aneurysm. Scattered aortic and coronary artery calcifications are again noted. Mediastinum/Nodes: Small scattered mediastinal and hilar lymph nodes are stable. No mass or overt adenopathy. The esophagus is grossly normal. Lungs/Pleura: Stable underlying emphysematous changes. No acute pulmonary findings. Stable irregular nodule in the right upper lobe on images 44 and 45/3. This is best measured on the coronal images and measures approximately 7.5 mm. When measured on the prior coronal images it is unchanged. It does have slightly spiculated margins but its stability over the past 2 years is certainly favorable. Given its morphology and the underlying emphysema would recommend a follow-up noncontrast chest CT in 12 months. Sub 3 mm nodule noted in the right upper lobe peripherally on image 63. This is unchanged. No new pulmonary nodules.  No pleural effusions or pleural nodules. Upper Abdomen: High attenuation material in gallbladder is likely sludge. Is also a stable gallstone noted. No upper abdominal mass or adenopathy. Musculoskeletal: No chest wall mass is identified. Slightly progressive left-sided gynecomastia. Recommend correlation with physical examination. No supraclavicular or axillary adenopathy. Small  scattered lymph nodes appears stable. The thyroid gland is grossly normal. The bony structures are intact. IMPRESSION: 1. Stable 7.5 mm right upper lobe pulmonary nodule. Recommend follow-up noncontrast chest CT in 12 months. 2. Stable underlying emphysematous changes. 3. No mediastinal or hilar mass or adenopathy. 4. Slightly progressive left-sided gynecomastia. Recommend correlation with physical examination. 5. Stable cholelithiasis and gallbladder sludge. 6. Emphysema and aortic atherosclerosis. Aortic Atherosclerosis (ICD10-I70.0) and Emphysema (ICD10-J43.9). Electronically Signed   By: 04/21/2018 M.D.   On: 07/12/2020 15:41      No flowsheet data found.  No results found for: NITRICOXIDE      Assessment & Plan:   Pulmonary nodule No significant change in 7.5 mm pulmonary nodule over the last 2 years.  However margins do show slight spiculation.  We will repeat CT chest in 1 year.  Chronic cough Chronic cough in a never smoker.  Questionable etiology may be a component of GERD plus or minus chronic rhinitis.  Patient does have underlying Parkinson's so dysphagia may be contributing factor as well. No evidence of any pneumonias or acute pulmonary issues on recent CT chest. There is some notable emphysema and a never smoker.  He does have secondhand smoke exposure.  Will check PFTs on return. We will control for GERD and postnasal drainage.  Plan  Patient Instructions  Continue on Pepcid 20mg   Twice daily   Add Claritin 10mg  daily  Add Deslym 2 tsp Twice daily  For cough As needed   Set up CT chest in   1 year (lung nodule)  Follow up with Primary provider for breast tenderness.  Follow up with Dr.  In 3 months with PFT and As needed   Please contact office for sooner follow up if symptoms do not improve or worsen or seek emergency care       Gynecomastia, male Left-sided gynecomasty with positive left-sided breast tenderness. Patient is advised to follow-up with a  primary care provider and decide if additional testing such as diagnostic breast ultrasound is indicated      , NP 08/06/2020

## 2020-08-06 NOTE — Assessment & Plan Note (Signed)
No significant change in 7.5 mm pulmonary nodule over the last 2 years.  However margins do show slight spiculation.  We will repeat CT chest in 1 year.

## 2020-08-06 NOTE — Patient Instructions (Addendum)
Continue on Pepcid 20mg   Twice daily   Add Claritin 10mg  daily  Add Deslym 2 tsp Twice daily  For cough As needed   Set up CT chest in   1 year (lung nodule)  Follow up with Primary provider for breast tenderness.  Follow up with Dr.  In 3 months with PFT and As needed   Please contact office for sooner follow up if symptoms do not improve or worsen or seek emergency care

## 2021-07-15 ENCOUNTER — Other Ambulatory Visit: Payer: Self-pay

## 2021-08-03 IMAGING — CT CT CHEST W/O CM
1 series · 15 of 34 positions shown, 19 images · non-contrast
Comparison: CT scan 08/24/2018 in 04/19/2018

CLINICAL DATA: Followup pulmonary nodule.

EXAM:
CT CHEST WITHOUT CONTRAST
TECHNIQUE: Multidetector CT imaging of the chest was performed following the
standard protocol without IV contrast.

[Series 2: chest w/(date) · axial · 0.82mm/px · z∈[-380,-78]mm · 15 of 179 slices shown, 19 images]
[im 14/179  mediastinal]
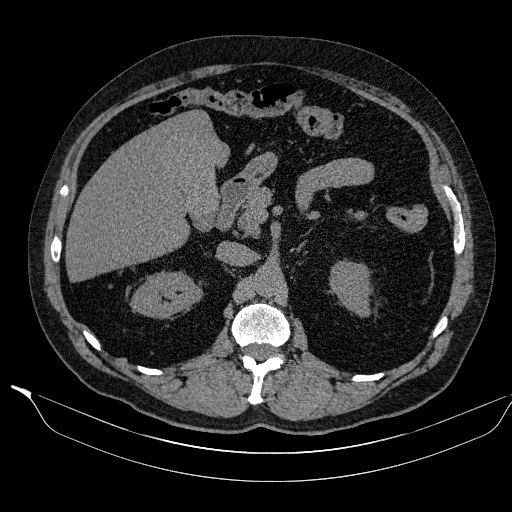
[im 14/179  lung]
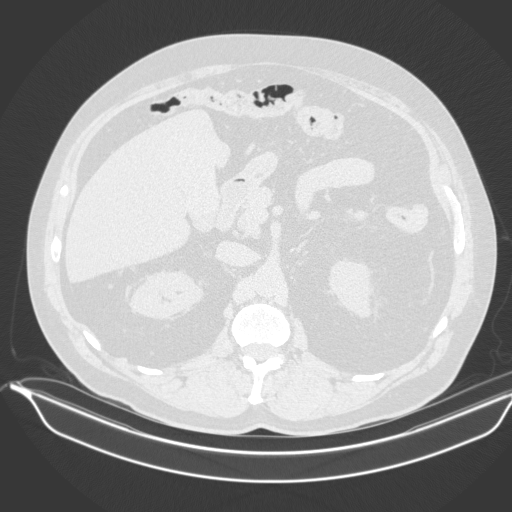
[im 27/179  lung]
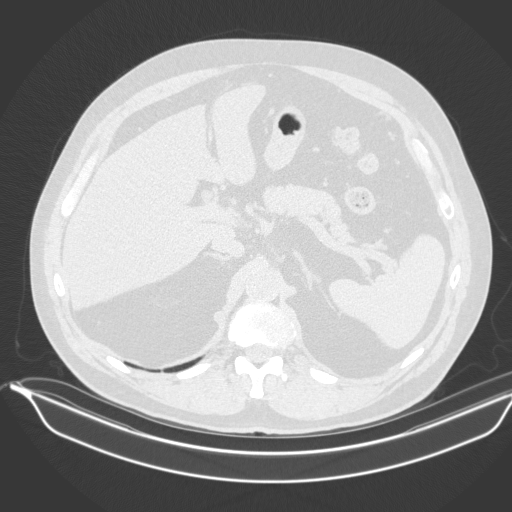
[im 36/179  lung]
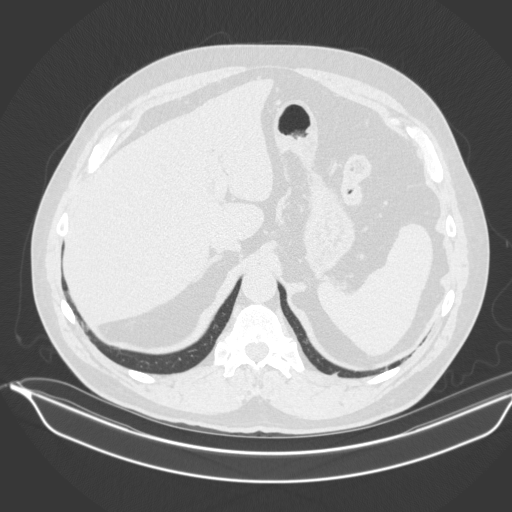
[im 47/179  lung]
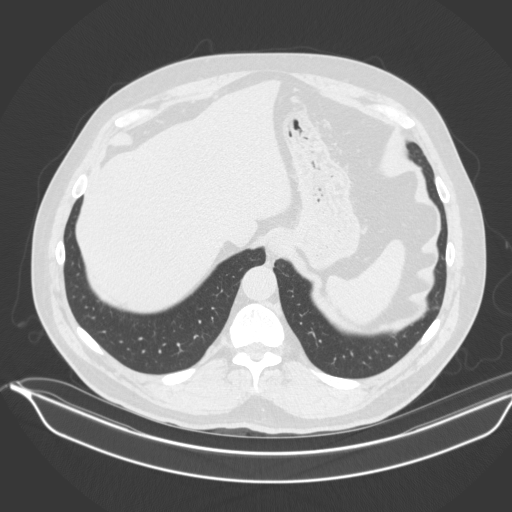
[im 60/179  mediastinal]
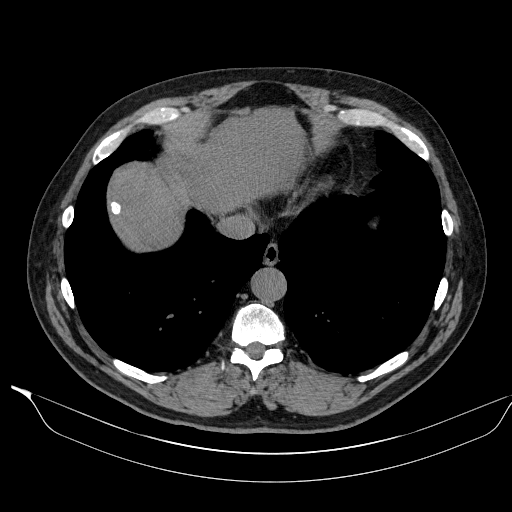
[im 60/179  lung]
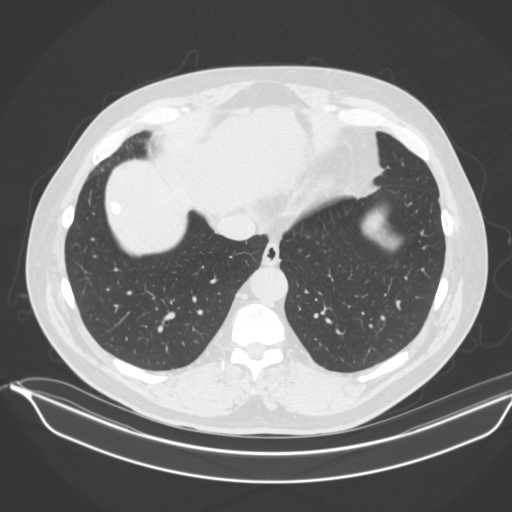
[im 72/179  lung]
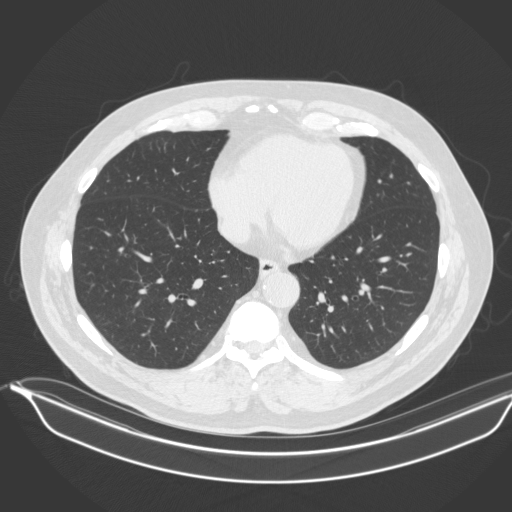
[im 80/179  lung]
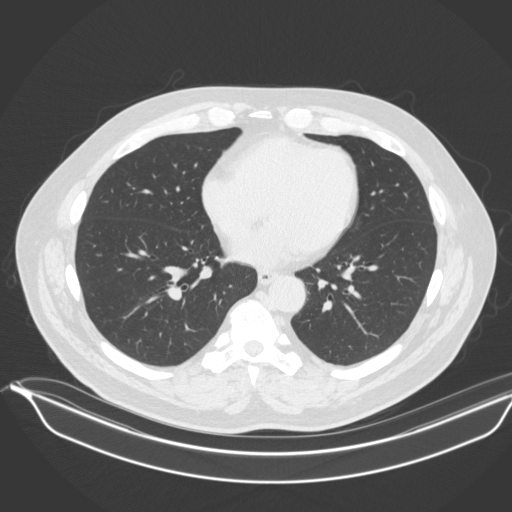
[im 93/179  lung]
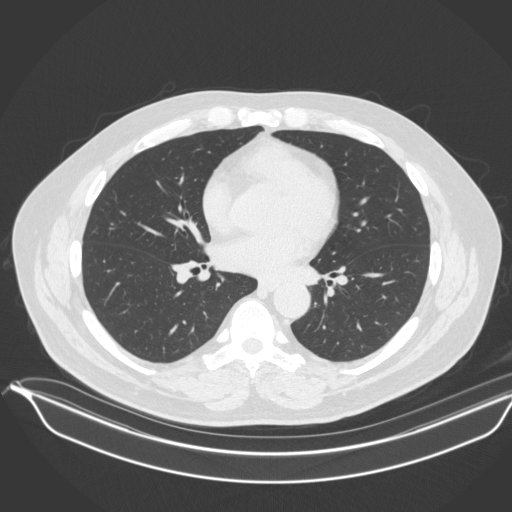
[im 99/179  mediastinal]
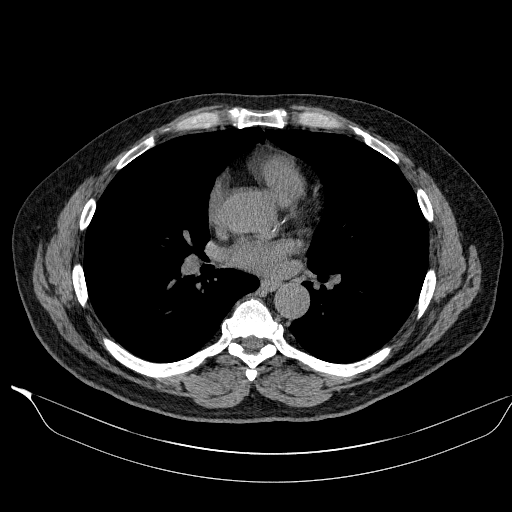
[im 99/179  lung]
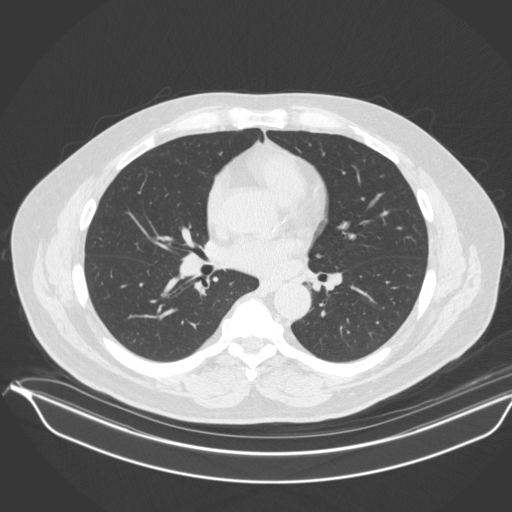
[im 107/179  lung]
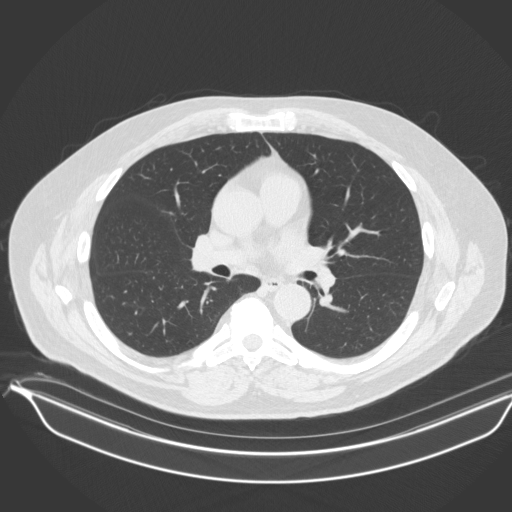
[im 119/179  lung]
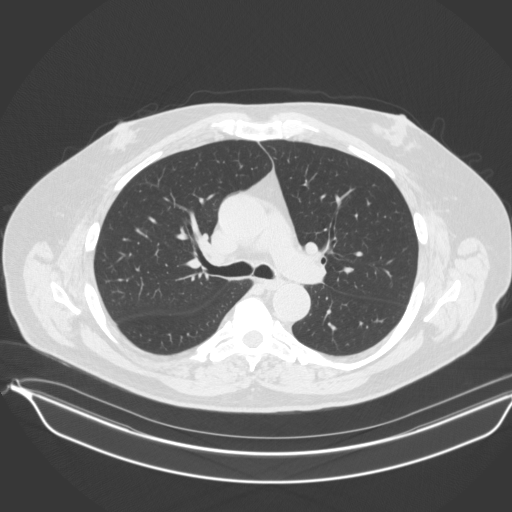
[im 132/179  lung]
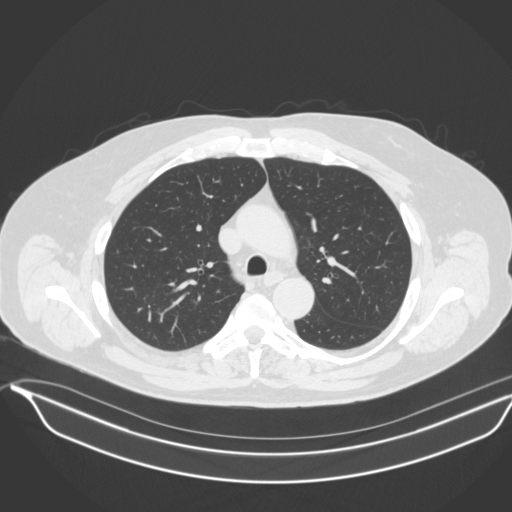
[im 143/179  mediastinal]
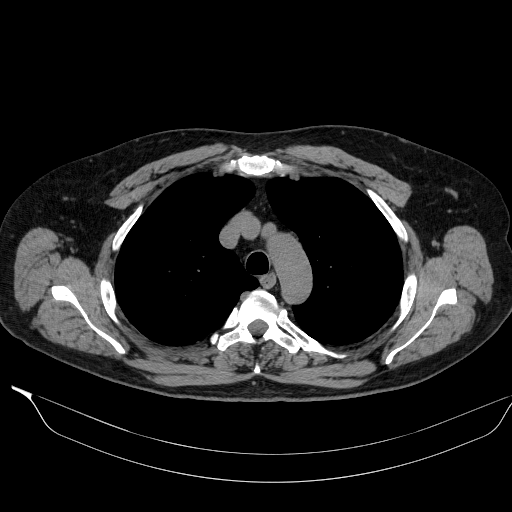
[im 143/179  lung]
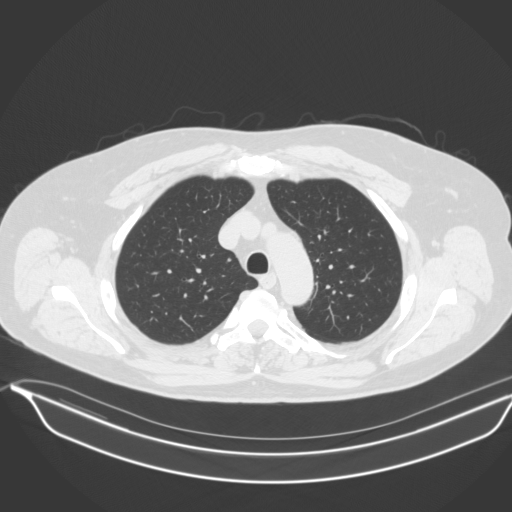
[im 152/179  lung]
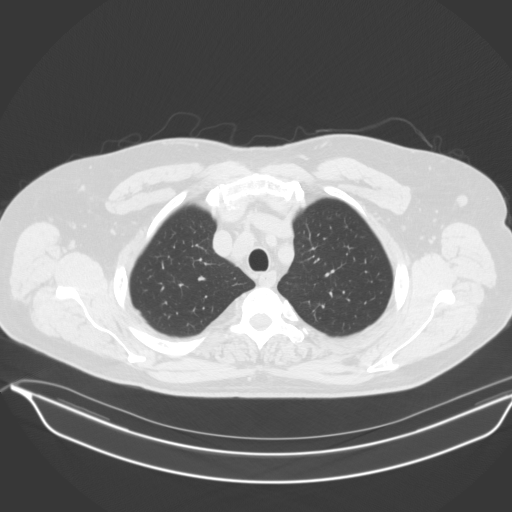
[im 165/179  lung]
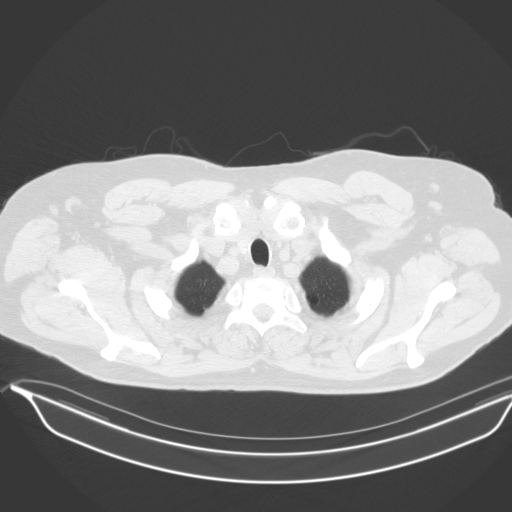

[15 of 34 positions shown; findings below may reference images not displayed]

FINDINGS: Cardiovascular: The heart is normal in size. No pericardial
effusion. The aorta demonstrates mild stable tortuosity and ectasia
but no focal aneurysm. Scattered aortic and coronary artery
calcifications are again noted.

Mediastinum/Nodes: Small scattered mediastinal and hilar lymph nodes
are stable. No mass or overt adenopathy. The esophagus is grossly
normal.

Lungs/Pleura: Stable underlying emphysematous changes.

No acute pulmonary findings.

Stable irregular nodule in the right upper lobe on images 44 and
45/3. This is best measured on the coronal images and measures
approximately 7.5 mm. When measured on the prior coronal images it
is unchanged. It does have slightly spiculated margins but its
stability over the past 2 years is certainly favorable. Given its
morphology and the underlying emphysema would recommend a follow-up
noncontrast chest CT in 12 months. Sub 3 mm nodule noted in the
right upper lobe peripherally on image 63. This is unchanged.

No new pulmonary nodules.  No pleural effusions or pleural nodules.

Upper Abdomen: High attenuation material in gallbladder is likely
sludge. Is also a stable gallstone noted. No upper abdominal mass or
adenopathy.

Musculoskeletal: No chest wall mass is identified. Slightly
progressive left-sided gynecomastia. Recommend correlation with
physical examination.

No supraclavicular or axillary adenopathy. Small scattered lymph
nodes appears stable. The thyroid gland is grossly normal.

The bony structures are intact.
IMPRESSION: 1. Stable 7.5 mm right upper lobe pulmonary nodule. Recommend
follow-up noncontrast chest CT in 12 months.
2. Stable underlying emphysematous changes.
3. No mediastinal or hilar mass or adenopathy.
4. Slightly progressive left-sided gynecomastia. Recommend
correlation with physical examination.
5. Stable cholelithiasis and gallbladder sludge.
6. Emphysema and aortic atherosclerosis.

Aortic Atherosclerosis (ZL2QV-L57.7) and Emphysema ([id number].P).
